# Patient Record
Sex: Female | Born: 1955 | Hispanic: Yes | Marital: Married | Smoking: Never smoker
Health system: Southern US, Community
[De-identification: ages and names within clinical notes are randomized; demographics above are authoritative.]

## PROBLEM LIST (undated history)

## (undated) DIAGNOSIS — I428 Other cardiomyopathies: Secondary | ICD-10-CM

## (undated) DIAGNOSIS — I5020 Unspecified systolic (congestive) heart failure: Secondary | ICD-10-CM

## (undated) DIAGNOSIS — E785 Hyperlipidemia, unspecified: Secondary | ICD-10-CM

## (undated) HISTORY — DX: Other cardiomyopathies: I42.8

## (undated) HISTORY — PX: TONSILLECTOMY: SUR1361

## (undated) HISTORY — DX: Hyperlipidemia, unspecified: E78.5

## (undated) HISTORY — PX: APPENDECTOMY: SHX54

## (undated) HISTORY — DX: Unspecified systolic (congestive) heart failure: I50.20

---

## 2007-10-25 ENCOUNTER — Emergency Department: Payer: Self-pay | Admitting: Emergency Medicine

## 2007-10-25 ENCOUNTER — Ambulatory Visit: Payer: Self-pay | Admitting: Internal Medicine

## 2007-10-25 ENCOUNTER — Other Ambulatory Visit: Payer: Self-pay

## 2007-10-26 ENCOUNTER — Ambulatory Visit: Payer: Self-pay | Admitting: Emergency Medicine

## 2008-09-03 ENCOUNTER — Ambulatory Visit: Payer: Self-pay | Admitting: Family Medicine

## 2008-09-06 ENCOUNTER — Ambulatory Visit: Payer: Self-pay | Admitting: Family Medicine

## 2010-12-11 IMAGING — CR DG HIP COMPLETE 2+V*L*
1 series · 2 of 2 positions shown · non-contrast
Comparison: none

REASON FOR EXAM: pain left hip
COMMENTS:

PROCEDURE:     MDR - MDR HIP LEFT COMPLETE  - September 03, 2008  [DATE]
RESULT:     AP and lateral views of the left hip reveal no evidence of
fracture nor dislocation nor significant degenerative change. The observed
portions of the left hemipelvis appear normal.

[Series 1: view not recorded · 0.17mm/px · 2 of 2 slices shown]
[im 1/2]
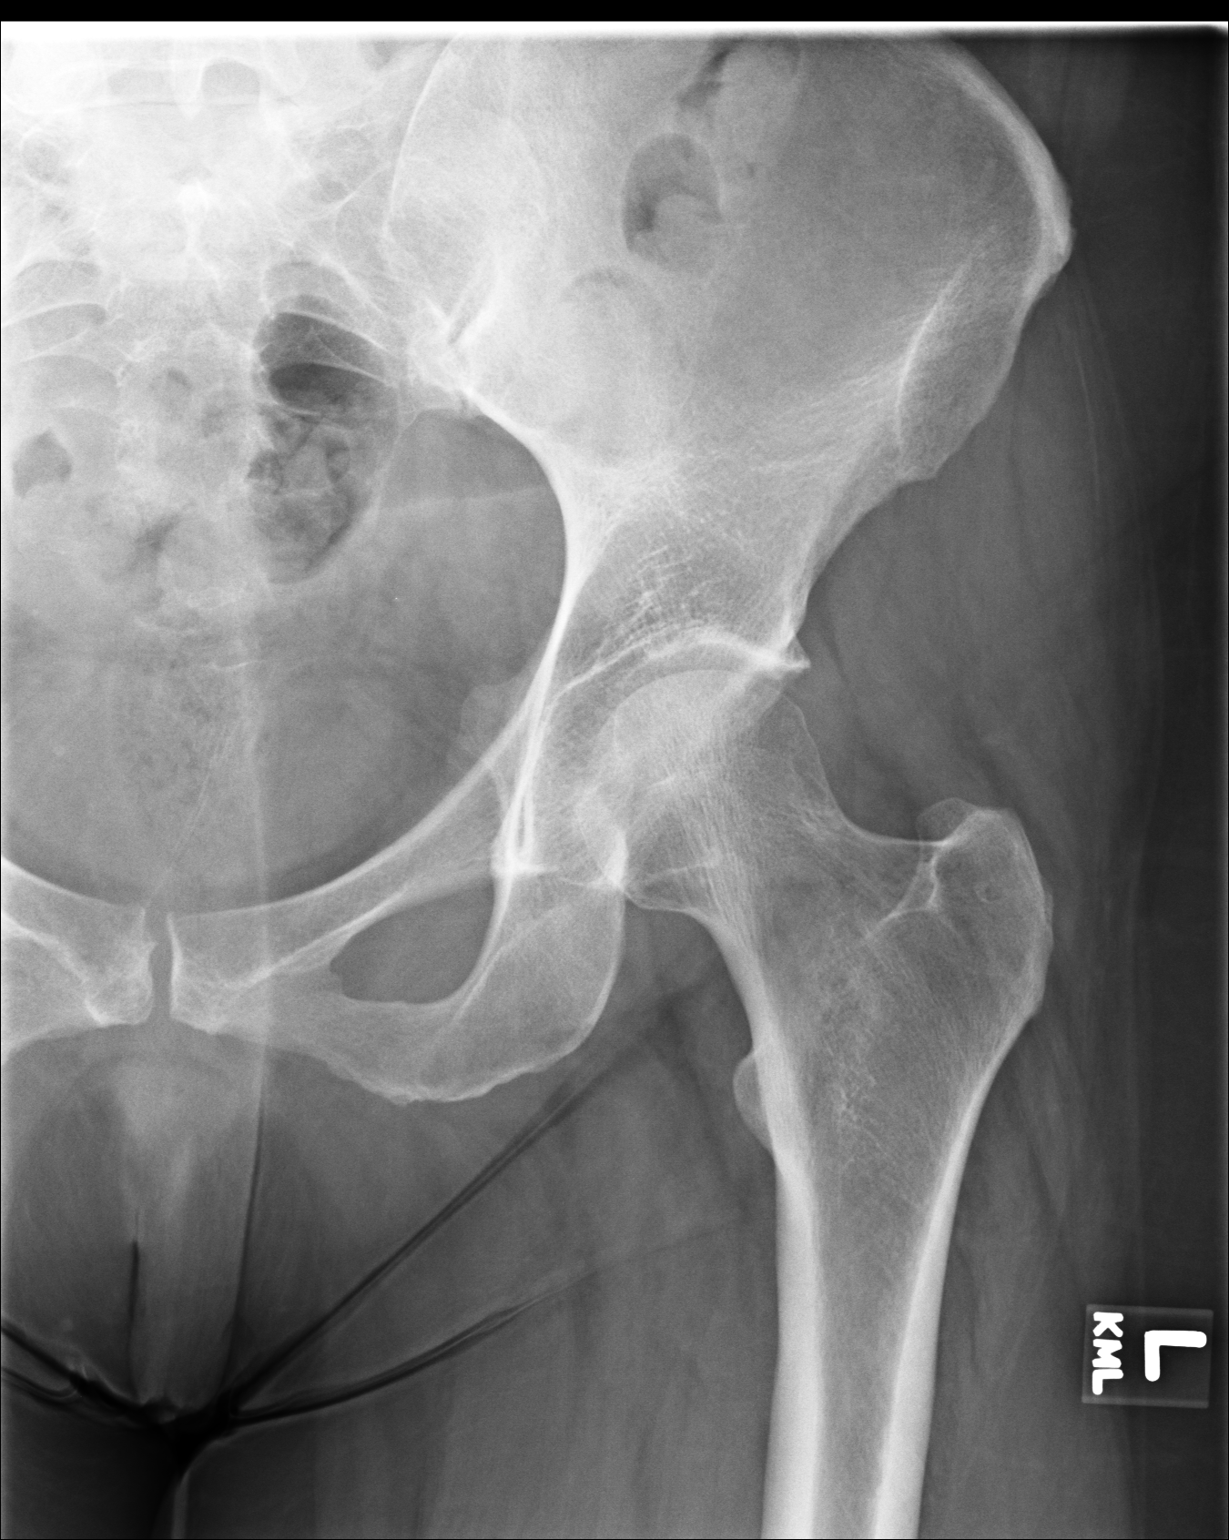
[im 2/2]
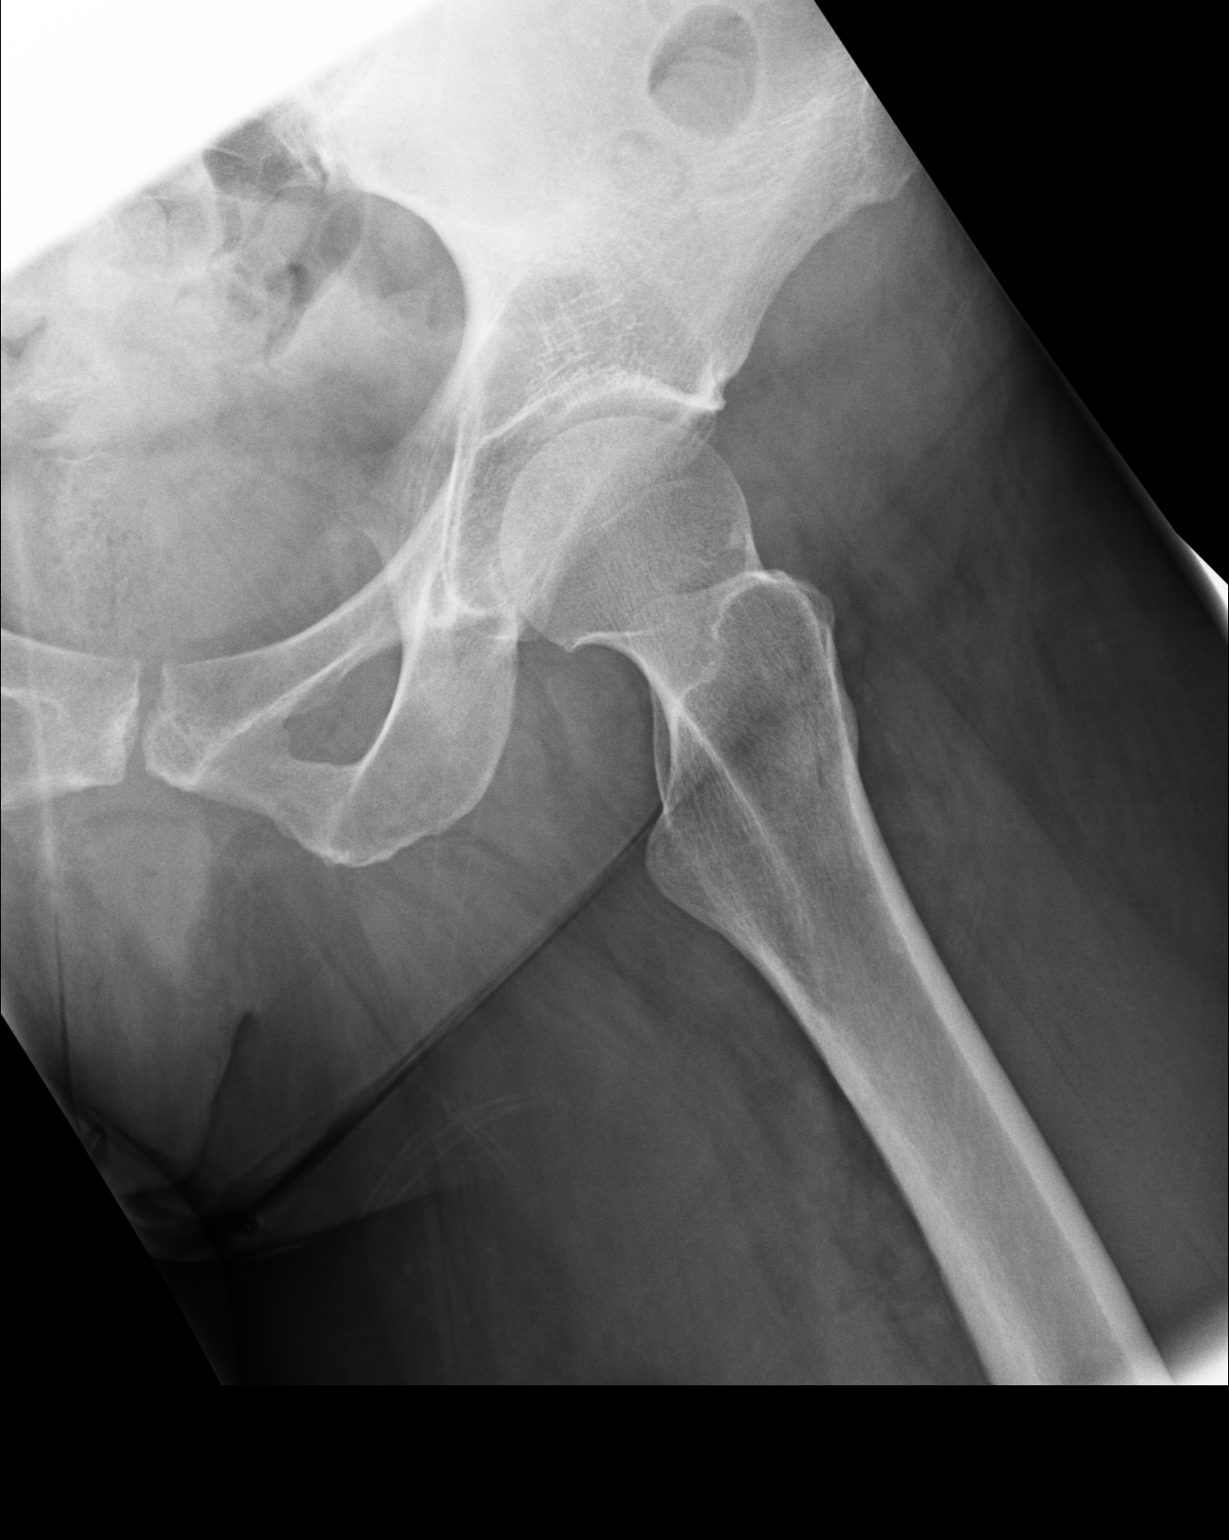

[2 of 2 positions shown; findings below may reference images not displayed]

IMPRESSION: I see no acute abnormality of the left hip. Followup
imaging is available if the patient's symptoms persist and remain
unexplained.

## 2011-04-27 ENCOUNTER — Ambulatory Visit: Payer: Self-pay | Admitting: Pediatrics

## 2018-04-12 ENCOUNTER — Ambulatory Visit
Admission: EM | Admit: 2018-04-12 | Discharge: 2018-04-12 | Disposition: A | Discharge status: Home / Self Care | Payer: BC Managed Care – PPO | Attending: Family Medicine | Admitting: Family Medicine

## 2018-04-12 ENCOUNTER — Other Ambulatory Visit: Payer: Self-pay

## 2018-04-12 ENCOUNTER — Encounter: Payer: Self-pay | Admitting: Emergency Medicine

## 2018-04-12 DIAGNOSIS — J111 Influenza due to unidentified influenza virus with other respiratory manifestations: Secondary | ICD-10-CM | POA: Diagnosis not present

## 2018-04-12 LAB — RAPID INFLUENZA A&B ANTIGENS: Influenza A (ARMC): POSITIVE — AB

## 2018-04-12 LAB — RAPID INFLUENZA A&B ANTIGENS (ARMC ONLY): INFLUENZA B (ARMC): NEGATIVE

## 2018-04-12 MED ORDER — OSELTAMIVIR PHOSPHATE 75 MG PO CAPS: 75.0000 mg | ORAL_CAPSULE | Freq: Two times a day (BID) | ORAL | 0 refills | Status: DC

## 2018-04-12 NOTE — ED Triage Notes (Signed)
Patient c/o cough, fever, and bodyaches that started 2 days ago.

## 2018-04-12 NOTE — ED Provider Notes (Signed)
[facility]    CSN: [id number] Arrival date & time: 04/12/18  1642  History   Chief Complaint Chief Complaint  Patient presents with  . Cough  . Fever  . Generalized Body Aches   HPI  63 year old female presents with concerns for influenza.  Symptoms started on Sunday.  Patient reports fever, chills, body aches, cough.  Associated weakness.  Symptoms are severe.  Patient is concerned that she has the flu.  No known exacerbating relieving factors.  No other associated symptoms.  No other complaints.  History reviewed and updated as below.  PMH:  Cervical radiculopathy History of abnormal mammogram  Past Surgical History:  Procedure Laterality Date  . APPENDECTOMY    . CESAREAN SECTION    . TONSILLECTOMY     OB History   No obstetric history on file.    Home Medications    Prior to Admission medications   Medication Sig Start Date End Date Taking? Authorizing Provider  oseltamivir (TAMIFLU) 75 MG capsule Take 1 capsule (75 mg total) by mouth every 12 (twelve) hours. 04/12/18   Tommie Sams, DO   Social History Social History   Tobacco Use  . Smoking status: Never Smoker  . Smokeless tobacco: Never Used  Substance Use Topics  . Alcohol use: Yes  . Drug use: Never   Allergies   Patient has no known allergies.   Review of Systems Review of Systems  Constitutional: Positive for chills and fever.  Respiratory: Positive for cough.   Musculoskeletal:       Body aches.   Neurological: Positive for weakness.   Physical Exam Triage Vital Signs ED Triage Vitals  Enc Vitals Group     BP 04/12/18 1659 102/81     Pulse Rate 04/12/18 1659 84     Resp 04/12/18 1659 14     Temp 04/12/18 1659 98.6 F (37 C)     Temp Source 04/12/18 1659 Oral     SpO2 04/12/18 1659 96 %     Weight 04/12/18 1657 150 lb (68 kg)     Height 04/12/18 1657 5\' 2"  (1.575 m)     Head Circumference --      Peak Flow --      Pain Score 04/12/18 1656 4     Pain Loc --    Pain Edu? --      Excl. in GC? --    Updated Vital Signs BP 102/81 (BP Location: Left Arm)   Pulse 84   Temp 98.6 F (37 C) (Oral)   Resp 14   Ht 5\' 2"  (1.575 m)   Wt 68 kg   SpO2 96%   BMI 27.44 kg/m   Visual Acuity Right Eye Distance:   Left Eye Distance:   Bilateral Distance:    Right Eye Near:   Left Eye Near:    Bilateral Near:     Physical Exam Vitals signs and nursing note reviewed.  Constitutional:      General: She is not in acute distress.    Appearance: Normal appearance.  HENT:     Head: Normocephalic and atraumatic.     Right Ear: Tympanic membrane normal.     Left Ear: Tympanic membrane normal.     Mouth/Throat:     Pharynx: Oropharynx is clear. No posterior oropharyngeal erythema.  Eyes:     General:        Right eye: No discharge.        Left eye: No discharge.  Conjunctiva/sclera: Conjunctivae normal.  Cardiovascular:     Rate and Rhythm: Normal rate and regular rhythm.  Pulmonary:     Effort: Pulmonary effort is normal.     Breath sounds: Normal breath sounds.  Neurological:     Mental Status: She is alert.  Psychiatric:        Mood and Affect: Mood normal.        Behavior: Behavior normal.    UC Treatments / Results  Labs (all labs ordered are listed, but only abnormal results are displayed) Labs Reviewed  RAPID INFLUENZA A&B ANTIGENS ([facility] ONLY) - Abnormal; Notable for the following components:      Result Value   Influenza A ([facility]) POSITIVE (*)    All other components within normal limits    EKG None  Radiology No results found.  Procedures Procedures (including critical care time)  Medications Ordered in UC Medications - No data to display  Initial Impression / Assessment and Plan / UC Course  I have reviewed the triage vital signs and the nursing notes.  Pertinent labs & imaging results that were available during my care of the patient were reviewed by me and considered in my medical decision making (see chart for  details).    63 year old female presents with influenza.  Treating with Tamiflu.  Work note given.  Final Clinical Impressions(s) / UC Diagnoses   Final diagnoses:  Influenza   Discharge Instructions   None    ED Prescriptions    Medication Sig Dispense Auth. Provider   oseltamivir (TAMIFLU) 75 MG capsule Take 1 capsule (75 mg total) by mouth every 12 (twelve) hours. 10 capsule Tommie Sams, DO     Controlled Substance Prescriptions [place] Controlled Substance Registry consulted? Not Applicable   Tommie Sams, DO 04/12/18 1815

## 2019-03-20 ENCOUNTER — Other Ambulatory Visit: Payer: Self-pay

## 2019-03-20 DIAGNOSIS — Z20822 Contact with and (suspected) exposure to covid-19: Secondary | ICD-10-CM

## 2019-03-21 LAB — NOVEL CORONAVIRUS, NAA: SARS-CoV-2, NAA: NOT DETECTED

## 2019-05-15 ENCOUNTER — Other Ambulatory Visit: Payer: Self-pay

## 2019-05-15 DIAGNOSIS — Z20822 Contact with and (suspected) exposure to covid-19: Secondary | ICD-10-CM

## 2019-05-16 LAB — SARS-COV-2, NAA 2 DAY TAT

## 2019-05-16 LAB — NOVEL CORONAVIRUS, NAA: SARS-CoV-2, NAA: NOT DETECTED

## 2019-08-14 ENCOUNTER — Other Ambulatory Visit: Payer: Self-pay

## 2019-08-14 DIAGNOSIS — Z20822 Contact with and (suspected) exposure to covid-19: Secondary | ICD-10-CM

## 2019-08-15 LAB — SARS-COV-2, NAA 2 DAY TAT

## 2019-08-15 LAB — NOVEL CORONAVIRUS, NAA: SARS-CoV-2, NAA: NOT DETECTED

## 2019-10-18 ENCOUNTER — Other Ambulatory Visit: Payer: Self-pay | Admitting: Radiology

## 2019-10-18 ENCOUNTER — Other Ambulatory Visit: Payer: BC Managed Care – PPO

## 2019-10-18 DIAGNOSIS — Z20822 Contact with and (suspected) exposure to covid-19: Secondary | ICD-10-CM

## 2019-10-20 LAB — NOVEL CORONAVIRUS, NAA: SARS-CoV-2, NAA: NOT DETECTED

## 2019-10-20 LAB — SARS-COV-2, NAA 2 DAY TAT

## 2020-06-20 ENCOUNTER — Ambulatory Visit
Admission: EM | Admit: 2020-06-20 | Discharge: 2020-06-20 | Disposition: A | Discharge status: Home / Self Care | Payer: BC Managed Care – PPO | Attending: Family Medicine | Admitting: Family Medicine

## 2020-06-20 ENCOUNTER — Encounter: Payer: Self-pay | Admitting: Emergency Medicine

## 2020-06-20 ENCOUNTER — Other Ambulatory Visit: Payer: Self-pay

## 2020-06-20 DIAGNOSIS — N390 Urinary tract infection, site not specified: Secondary | ICD-10-CM | POA: Diagnosis present

## 2020-06-20 LAB — URINALYSIS, COMPLETE (UACMP) WITH MICROSCOPIC
Bilirubin Urine: NEGATIVE
Glucose, UA: NEGATIVE mg/dL
Ketones, ur: NEGATIVE mg/dL
Nitrite: NEGATIVE
Protein, ur: NEGATIVE mg/dL
Specific Gravity, Urine: 1.02 (ref 1.005–1.030)
WBC, UA: >50 WBC/hpf (ref 0–5)
pH: 7 (ref 5.0–8.0)

## 2020-06-20 MED ORDER — PHENAZOPYRIDINE HCL 200 MG PO TABS: 200.0000 mg | ORAL_TABLET | Freq: Three times a day (TID) | ORAL | 0 refills | Status: DC

## 2020-06-20 MED ORDER — NITROFURANTOIN MONOHYD MACRO 100 MG PO CAPS: 100.0000 mg | ORAL_CAPSULE | Freq: Two times a day (BID) | ORAL | 0 refills | Status: DC

## 2020-06-20 NOTE — ED Provider Notes (Signed)
[facility]    CSN: [id number] Arrival date & time: 06/20/20  1223      History   Chief Complaint Chief Complaint  Patient presents with  . Dysuria  . Urinary Frequency    HPI KEYIA MORETTO is a 65 y.o. female.   HPI   65 year old female here for evaluation of urinary complaints.  Patient reports that 4 days ago she developed some painful urination with urinary urgency, frequency, and hematuria.  She reports that the symptoms resolved the 2 days following and then returned yesterday.  She states that she is still having painful urination and urinary frequency and she has seen some blood in her urine.  She denies fever, abdominal pain, back pain, or cloudiness to her urine.  History reviewed. No pertinent past medical history.  There are no problems to display for this patient.   Past Surgical History:  Procedure Laterality Date  . APPENDECTOMY    . CESAREAN SECTION    . TONSILLECTOMY      OB History   No obstetric history on file.      Home Medications    Prior to Admission medications   Medication Sig Start Date End Date Taking? Authorizing Provider  nitrofurantoin, macrocrystal-monohydrate, (MACROBID) 100 MG capsule Take 1 capsule (100 mg total) by mouth 2 (two) times daily. 06/20/20  Yes Becky Augusta, NP  phenazopyridine (PYRIDIUM) 200 MG tablet Take 1 tablet (200 mg total) by mouth 3 (three) times daily. 06/20/20  Yes Becky Augusta, NP    Family History History reviewed. No pertinent family history.  Social History Social History   Tobacco Use  . Smoking status: Never Smoker  . Smokeless tobacco: Never Used  Vaping Use  . Vaping Use: Never used  Substance Use Topics  . Alcohol use: Yes  . Drug use: Never     Allergies   Patient has no known allergies.   Review of Systems Review of Systems  Constitutional: Negative for activity change, appetite change and fever.  Gastrointestinal: Negative for abdominal pain, diarrhea, nausea and  vomiting.  Genitourinary: Positive for dysuria, frequency, hematuria and urgency.  Musculoskeletal: Negative for back pain.  Skin: Negative for rash.  Hematological: Negative.   Psychiatric/Behavioral: Negative.      Physical Exam Triage Vital Signs ED Triage Vitals  Enc Vitals Group     BP 06/20/20 1254 124/82     Pulse Rate 06/20/20 1254 64     Resp 06/20/20 1254 18     Temp 06/20/20 1254 98.1 F (36.7 C)     Temp Source 06/20/20 1254 Oral     SpO2 06/20/20 1254 100 %     Weight 06/20/20 1253 149 lb 14.6 oz (68 kg)     Height 06/20/20 1253 5\' 2"  (1.575 m)     Head Circumference --      Peak Flow --      Pain Score 06/20/20 1253 0     Pain Loc --      Pain Edu? --      Excl. in GC? --    No data found.  Updated Vital Signs BP 124/82 (BP Location: Right Arm)   Pulse 64   Temp 98.1 F (36.7 C) (Oral)   Resp 18   Ht 5\' 2"  (1.575 m)   Wt 149 lb 14.6 oz (68 kg)   SpO2 100%   BMI 27.42 kg/m   Visual Acuity Right Eye Distance:   Left Eye Distance:   Bilateral Distance:  Right Eye Near:   Left Eye Near:    Bilateral Near:     Physical Exam Vitals and nursing note reviewed.  Constitutional:      General: She is not in acute distress.    Appearance: Normal appearance. She is normal weight. She is not ill-appearing.  HENT:     Head: Normocephalic and atraumatic.  Cardiovascular:     Rate and Rhythm: Normal rate and regular rhythm.     Pulses: Normal pulses.     Heart sounds: Normal heart sounds. No murmur heard. No gallop.   Pulmonary:     Effort: Pulmonary effort is normal.     Breath sounds: Normal breath sounds. No wheezing, rhonchi or rales.  Abdominal:     General: Abdomen is flat.     Palpations: Abdomen is soft.     Tenderness: There is no abdominal tenderness. There is no right CVA tenderness, left CVA tenderness, guarding or rebound.  Skin:    General: Skin is warm and dry.     Capillary Refill: Capillary refill takes less than 2 seconds.      Findings: No erythema or rash.  Neurological:     General: No focal deficit present.     Mental Status: She is alert and oriented to person, place, and time.  Psychiatric:        Mood and Affect: Mood normal.        Behavior: Behavior normal.        Thought Content: Thought content normal.        Judgment: Judgment normal.      UC Treatments / Results  Labs (all labs ordered are listed, but only abnormal results are displayed) Labs Reviewed  URINALYSIS, COMPLETE (UACMP) WITH MICROSCOPIC - Abnormal; Notable for the following components:      Result Value   APPearance HAZY (*)    Hgb urine dipstick MODERATE (*)    Leukocytes,Ua SMALL (*)    Bacteria, UA MANY (*)    All other components within normal limits  URINE CULTURE    EKG   Radiology No results found.  Procedures Procedures (including critical care time)  Medications Ordered in UC Medications - No data to display  Initial Impression / Assessment and Plan / UC Course  I have reviewed the triage vital signs and the nursing notes.  Pertinent labs & imaging results that were available during my care of the patient were reviewed by me and considered in my medical decision making (see chart for details).   Patient is a very pleasant 65 year old female who is here for evaluation of urinary complaints of been going on for the past 4 days off and on.  She is complaining of pain with urination, frequency, blood in her urine, and states she had urgency on the first day but has not since then.  She denies fever, abdominal pain, back pain, or cloudiness to her urine.  Patient's physical exam reveals a benign cardiopulmonary exam.  Abdomen is soft and nontender to palpation.  No CVA tenderness on exam.  Will send urine for analysis.  Urinalysis shows moderate hemoglobin, small leukocytes, greater than 50 WBCs, many bacteria, and WBCs in clumps.  We will send urine for culture.  We will treat patient for UTI with Macrobid twice  daily for 5 days and Pyridium for urinary discomfort.   Final Clinical Impressions(s) / UC Diagnoses   Final diagnoses:  Lower urinary tract infectious disease     Discharge Instructions  Take the Macrobid twice daily for 5 days with food for treatment of urinary tract infection.  Use the Pyridium every 8 hours as needed for urinary discomfort.  This will turn your urine a bright red-orange.  Increase your oral fluid intake so that you increase your urine production and or flushing your urinary system.  Take an over-the-counter probiotic, such as Culturelle-Align-Activia, 1 hour after each dose of antibiotic to prevent diarrhea or yeast infections from forming.  We will culture urine and change the antibiotics if necessary.  Return for reevaluation, or see your primary care provider, for any new or worsening symptoms.     ED Prescriptions    Medication Sig Dispense Auth. Provider   nitrofurantoin, macrocrystal-monohydrate, (MACROBID) 100 MG capsule Take 1 capsule (100 mg total) by mouth 2 (two) times daily. 10 capsule Becky Augusta, NP   phenazopyridine (PYRIDIUM) 200 MG tablet Take 1 tablet (200 mg total) by mouth 3 (three) times daily. 6 tablet Becky Augusta, NP     PDMP not reviewed this encounter.   Becky Augusta, NP 06/20/20 1355

## 2020-06-20 NOTE — ED Triage Notes (Signed)
Patient c/o dysuria and urinary frequency that started on Sunday.

## 2020-06-20 NOTE — Discharge Instructions (Addendum)

## 2020-06-22 LAB — URINE CULTURE
Culture: NO GROWTH
Special Requests: NORMAL

## 2020-12-22 LAB — COLOGUARD: COLOGUARD: NEGATIVE

## 2020-12-22 LAB — EXTERNAL GENERIC LAB PROCEDURE: COLOGUARD: NEGATIVE

## 2021-03-23 ENCOUNTER — Encounter: Payer: Self-pay | Admitting: Emergency Medicine

## 2021-03-23 ENCOUNTER — Inpatient Hospital Stay
Admission: EM | Admit: 2021-03-23 | Discharge: 2021-03-25 | DRG: 280 | Disposition: A | Discharge status: Home / Self Care | Payer: BC Managed Care – PPO | Attending: Internal Medicine | Admitting: Internal Medicine

## 2021-03-23 ENCOUNTER — Other Ambulatory Visit: Payer: Self-pay

## 2021-03-23 ENCOUNTER — Emergency Department: Payer: BC Managed Care – PPO

## 2021-03-23 DIAGNOSIS — R7989 Other specified abnormal findings of blood chemistry: Secondary | ICD-10-CM | POA: Diagnosis present

## 2021-03-23 DIAGNOSIS — I5021 Acute systolic (congestive) heart failure: Secondary | ICD-10-CM | POA: Diagnosis present

## 2021-03-23 DIAGNOSIS — R778 Other specified abnormalities of plasma proteins: Secondary | ICD-10-CM

## 2021-03-23 DIAGNOSIS — Z8249 Family history of ischemic heart disease and other diseases of the circulatory system: Secondary | ICD-10-CM

## 2021-03-23 DIAGNOSIS — R0789 Other chest pain: Secondary | ICD-10-CM

## 2021-03-23 DIAGNOSIS — Z7983 Long term (current) use of bisphosphonates: Secondary | ICD-10-CM

## 2021-03-23 DIAGNOSIS — I214 Non-ST elevation (NSTEMI) myocardial infarction: Secondary | ICD-10-CM | POA: Diagnosis not present

## 2021-03-23 DIAGNOSIS — E785 Hyperlipidemia, unspecified: Secondary | ICD-10-CM | POA: Diagnosis present

## 2021-03-23 DIAGNOSIS — R079 Chest pain, unspecified: Secondary | ICD-10-CM | POA: Diagnosis present

## 2021-03-23 DIAGNOSIS — R001 Bradycardia, unspecified: Secondary | ICD-10-CM | POA: Diagnosis present

## 2021-03-23 DIAGNOSIS — Z20822 Contact with and (suspected) exposure to covid-19: Secondary | ICD-10-CM | POA: Diagnosis present

## 2021-03-23 LAB — BASIC METABOLIC PANEL
Anion gap: 11 (ref 5–15)
BUN: 20 mg/dL (ref 8–23)
CO2: 21 mmol/L — ABNORMAL LOW (ref 22–32)
Calcium: 9.5 mg/dL (ref 8.9–10.3)
Chloride: 107 mmol/L (ref 98–111)
Creatinine, Ser: 0.8 mg/dL (ref 0.44–1.00)
GFR, Estimated: >60 mL/min (ref >60)
Glucose, Bld: 111 mg/dL — ABNORMAL HIGH (ref 70–99)
Potassium: 3.5 mmol/L (ref 3.5–5.1)
Sodium: 139 mmol/L (ref 135–145)

## 2021-03-23 LAB — CBC
HCT: 42.8 % (ref 36.0–46.0)
Hemoglobin: 13.9 g/dL (ref 12.0–15.0)
MCH: 30.6 pg (ref 26.0–34.0)
MCHC: 32.5 g/dL (ref 30.0–36.0)
MCV: 94.3 fL (ref 80.0–100.0)
Platelets: 387 10*3/uL (ref 150–400)
RBC: 4.54 MIL/uL (ref 3.87–5.11)
RDW: 12.3 % (ref 11.5–15.5)
WBC: 9.7 10*3/uL (ref 4.0–10.5)
nRBC: 0 % (ref 0.0–0.2)

## 2021-03-23 LAB — RESP PANEL BY RT-PCR (FLU A&B, COVID) ARPGX2
Influenza A by PCR: NEGATIVE
Influenza B by PCR: NEGATIVE
SARS Coronavirus 2 by RT PCR: NEGATIVE

## 2021-03-23 LAB — PROTIME-INR
INR: 1 (ref 0.8–1.2)
Prothrombin Time: 13.4 seconds (ref 11.4–15.2)

## 2021-03-23 LAB — TROPONIN I (HIGH SENSITIVITY)
Troponin I (High Sensitivity): 7 ng/L (ref <18)
Troponin I (High Sensitivity): 554 ng/L (ref <18)

## 2021-03-23 LAB — APTT: aPTT: >200 seconds (ref 24–36)

## 2021-03-23 LAB — MAGNESIUM: Magnesium: 2.3 mg/dL (ref 1.7–2.4)

## 2021-03-23 LAB — TSH: TSH: 1.126 u[IU]/mL (ref 0.350–4.500)

## 2021-03-23 LAB — BRAIN NATRIURETIC PEPTIDE: B Natriuretic Peptide: 19.3 pg/mL (ref 0.0–100.0)

## 2021-03-23 LAB — PHOSPHORUS: Phosphorus: 2.7 mg/dL (ref 2.5–4.6)

## 2021-03-23 MED ORDER — ASPIRIN 81 MG PO CHEW
324.0000 mg | CHEWABLE_TABLET | Freq: Once | ORAL | Status: AC
  Administered 2021-03-23: 324 mg via ORAL
  Filled 2021-03-23: qty 4

## 2021-03-23 MED ORDER — NITROGLYCERIN 0.4 MG SL SUBL
0.4000 mg | SUBLINGUAL_TABLET | SUBLINGUAL | Status: DC | PRN
  Administered 2021-03-23 – 2021-03-24 (×6): 0.4 mg via SUBLINGUAL
  Filled 2021-03-23 (×5): qty 1

## 2021-03-23 MED ORDER — MELATONIN 5 MG PO TABS: 5.0000 mg | ORAL_TABLET | Freq: Every evening | ORAL | Status: DC | PRN

## 2021-03-23 MED ORDER — HEPARIN BOLUS VIA INFUSION
3300.0000 [IU] | Freq: Once | INTRAVENOUS | Status: AC
  Administered 2021-03-23: 3300 [IU] via INTRAVENOUS
  Filled 2021-03-23: qty 3300

## 2021-03-23 MED ORDER — ACETAMINOPHEN 650 MG RE SUPP: 650.0000 mg | Freq: Four times a day (QID) | RECTAL | Status: DC | PRN

## 2021-03-23 MED ORDER — ACETAMINOPHEN 500 MG PO TABS: 1000.0000 mg | ORAL_TABLET | Freq: Four times a day (QID) | ORAL | Status: DC | PRN

## 2021-03-23 MED ORDER — ACETAMINOPHEN 325 MG PO TABS: 650.0000 mg | ORAL_TABLET | Freq: Four times a day (QID) | ORAL | Status: DC | PRN

## 2021-03-23 MED ORDER — ONDANSETRON HCL 4 MG/2ML IJ SOLN: 4.0000 mg | Freq: Four times a day (QID) | INTRAMUSCULAR | Status: DC | PRN

## 2021-03-23 MED ORDER — ONDANSETRON HCL 4 MG PO TABS: 4.0000 mg | ORAL_TABLET | Freq: Four times a day (QID) | ORAL | Status: DC | PRN

## 2021-03-23 MED ORDER — HEPARIN (PORCINE) 25000 UT/250ML-% IV SOLN
750.0000 [IU]/h | INTRAVENOUS | Status: DC
  Administered 2021-03-23: 22:00:00 650 [IU]/h via INTRAVENOUS
  Filled 2021-03-23: qty 250

## 2021-03-23 NOTE — Assessment & Plan Note (Addendum)
-   Etiology work-up in progress - Differentials include atypical versus angina versus ACS - Continue heparin GTT - Continue nitroglycerin sublingual as needed for chest pain - Status post aspirin 324 mg p.o. on day of admission - Check BNP - Heart score is 5, with history (1), age (1), family history (1), and elevated trop (2) - PRN EKG order for recurrent chest pain/chest pressure - A.m. team to consult cardiology

## 2021-03-23 NOTE — Assessment & Plan Note (Signed)
-   Asymptomatic - PCP regular follow-up

## 2021-03-23 NOTE — Assessment & Plan Note (Addendum)
-   High-sensitivity troponin was initially 2 and increased to 554 - Continue heparin GTT - Complete echo ordered - Admit to telemetry cardiac, observation

## 2021-03-23 NOTE — H&P (Addendum)
History and Physical   Cindy [name] [id number] DOB: 1955/07/26 DOA: 03/23/2021  PCP: Danae Orleans, MD  Patient coming from: home via [place]  I have personally briefly reviewed patient's old medical records in [place].  Chief Concern: Chest pain  HPI: 66 year old female with no prior medical diagnosis who presents emergency department for chief concerns of left-sided chest pain.  Vitals in the emergency department showed temperature of 98.5, respiration rate of 16, heart rate of 54, initial blood pressure 136/86, SPO2 of 99% on room air.  Labs in the emergency department showed serum sodium 139, potassium 3.5, chloride of 107, bicarb 21, BUN of 20, serum creatinine of 0.80, nonfasting blood glucose 111, WBC 9.7, hemoglobin 13.9, platelets of 387.  High sensitive troponin was 2 and increased to 554.  At bedside, she was able to tell me her name, age, and current calendar year.   She states she presented to ED for chief concern of chest pain, that started while she was sitting and playing with her phone. She states that at the onset at it's peak, she felt like an elephant was sitting on her chest. She states hte initial pain was a 7-8/10. This started around 4 pm. And it lasted hours until she received the nitroglycerin in the ED.   She endorsed nausea, pain in her left arm (sharp). The left arm pain resolved first after she received NG SL. She denies headache. She endorsed breathing deeply due to the pain. She denies shortness of breath. She denies abdominal pain, diarrhea, dysuria, hematuria, vomiting.   She denies ever feeling this way before.   She denies feeling stressed. She reports that as of December 2022, her husband accepted a new job in [place] and they have been planning the moving and buying a new home. She states today, she was on the phone with him and told him that she felt so excited and she felt like she was planning her wedding all over again. She did not feel  particularly stress.   She denies trauma to her person. She reports the pain level is a 2/10 currently.   She reports 20 pound intentional weight loss with portion control after her PCP advised her to loose weight about mid year in 2022. She reports that she walks daily and her goal daily steps is 6000 per day.   Family history: Father had hypertension and heart stents (approximately 64 yo)  Social history: She lives with her husband and two kids. She denies tobacco and recreational drug use. She endorses infrequent etoh, last drink was 1 month ago. 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   ROS: Constitutional: no weight change, no fever ENT/Mouth: no sore throat, no rhinorrhea Eyes: no eye pain, no vision changes Cardiovascular: no chest pain, no dyspnea,  no edema, no palpitations Respiratory: no cough, no sputum, no wheezing Gastrointestinal: no nausea, no vomiting, no diarrhea, no constipation Genitourinary: no urinary incontinence, no dysuria, no hematuria Musculoskeletal: no arthralgias, no myalgias Skin: no skin lesions, no pruritus, Neuro: + weakness, no loss of consciousness, no syncope Psych: no anxiety, no depression, no decrease appetite Heme/Lymph: no bruising, no bleeding  ED Course: Discussed with emergency medicine provider, patient require hospitalization for chief concerns of NSTEMI.  Assessment/Plan  Principal Problem:   Chest pain Active Problems:   Elevated  troponin   Sinus bradycardia by electrocardiogram   NSTEMI (non-ST elevated myocardial infarction) (HCC)  * Chest pain Assessment & Plan - Etiology work-up in progress - Differentials include atypical versus angina versus ACS - Continue heparin GTT - Continue nitroglycerin sublingual as needed for chest pain - Status post aspirin  324 mg p.o. on day of admission - Check BNP, lipid panel in the AM - A.m. team to consider consultation to cardiology  NSTEMI (non-ST elevated myocardial infarction) ([place]) Elevated troponin Assessment & Plan - High-sensitivity troponin was initially 2 and increased to 554 - Continue heparin GTT - Repeat troponin - Admit to telemetry cardiac, observation  Sinus bradycardia by electrocardiogram Assessment & Plan - Asymptomatic - PCP regular follow-up  Chart reviewed.   DVT prophylaxis: Heparin GGT Code Status: full code  Diet: Heart healthy Family Communication: daughter at bedside with patient's permission Disposition Plan: Pending clinical course Consults called: Patient will need cardiology evaluation Admission status: Observation, telemetry cardiac  History reviewed. No pertinent past medical history.  Past Surgical History:  Procedure Laterality Date   APPENDECTOMY     CESAREAN SECTION     TONSILLECTOMY     Social History:  reports that she has never smoked. She has never used smokeless tobacco. She reports current alcohol use. She reports that she does not use drugs.  No Known Allergies Family History  Problem Relation Age of Onset   Hypertension Father    Heart disease Father    Family history: Family history reviewed and not pertinent.  Prior to Admission medications   Medication Sig Start Date End Date Taking? Authorizing Provider  alendronate (FOSAMAX) 70 MG tablet Take 70 mg by mouth once a week. 03/13/21  Yes [provider]   Physical Exam: Vitals:   03/23/21 1930 03/23/21 2000 03/23/21 2030 03/23/21 2130  BP: 131/69 117/70 (!) 116/101 128/63  Pulse: (!) 43 (!) 47 (!) 47 (!) 35  Resp: 11 17 13 20   Temp:      TempSrc:      SpO2: 100% 100% 100% (!) 81%  Weight:      Height:       Constitutional: appears younger than chronological age, NAD, calm, comfortable Eyes: PERRL, lids and conjunctivae normal ENMT: Mucous membranes are moist.  Posterior pharynx clear of any exudate or lesions. Age-appropriate dentition. Hearing appropriate Neck: normal, supple, no masses, no thyromegaly Respiratory: clear to auscultation bilaterally, no wheezing, no crackles. Normal respiratory effort. No accessory muscle use.  Cardiovascular: Sinus bradycardia, and regular rhythm, no murmurs / rubs / gallops. No extremity edema. 2+ pedal pulses. No carotid bruits.  Abdomen: no tenderness, no masses palpated, no hepatosplenomegaly. Bowel sounds positive.  Musculoskeletal: no clubbing / cyanosis. No joint deformity upper and lower extremities. Good ROM, no contractures, no atrophy. Normal muscle tone.  Skin: no rashes, lesions, ulcers. No induration Neurologic: Sensation intact. Strength 5/5 in all 4.  Psychiatric: Normal judgment and insight. Alert and oriented x 3. Normal mood.   EKG: independently reviewed, showing sinus bradycardia with rate of 51, QTc 409  Chest x-ray on Admission: I personally reviewed and I agree with radiologist reading as below.  DG Chest 2 View  Result Date: 03/23/2021 CLINICAL DATA:  Acute onset left-sided chest pain 20 minutes ago. Shortness of breath. EXAM: CHEST - 2 VIEW COMPARISON:  None. FINDINGS: The heart size and mediastinal contours are within normal limits. Both lungs are clear. The visualized skeletal structures are unremarkable. IMPRESSION: No active cardiopulmonary disease. Electronically Signed  By: Marlaine Hind M.D.   On: 03/23/2021 18:16    Labs on Admission: I have personally reviewed following labs  CBC: Recent Labs  Lab 03/23/21 1752  WBC 9.7  HGB 13.9  HCT 42.8  MCV 94.3  PLT [id number]   Basic Metabolic Panel: Recent Labs  Lab 03/23/21 1752  NA 139  K 3.5  CL 107  CO2 21*  GLUCOSE 111*  BUN 20  CREATININE 0.80  CALCIUM 9.5   GFR: Estimated Creatinine Clearance: 56.9 mL/min (by C-G formula based on SCr of 0.8 mg/dL).  Urine analysis:    Component Value Date/Time   COLORURINE YELLOW  06/20/2020 1256   APPEARANCEUR HAZY (A) 06/20/2020 1256   LABSPEC 1.020 06/20/2020 1256   PHURINE 7.0 06/20/2020 1256   GLUCOSEU NEGATIVE 06/20/2020 1256   HGBUR MODERATE (A) 06/20/2020 1256   BILIRUBINUR NEGATIVE 06/20/2020 1256   KETONESUR NEGATIVE 06/20/2020 1256   PROTEINUR NEGATIVE 06/20/2020 1256   NITRITE NEGATIVE 06/20/2020 1256   LEUKOCYTESUR SMALL (A) 06/20/2020 1256   Dr. Tobie Poet [facility]  If 7PM-7AM, please contact overnight-coverage provider If 7AM-7PM, please contact day coverage provider [email or web address]  03/23/2021, 10:36 PM

## 2021-03-23 NOTE — Hospital Course (Signed)
66 year old female with no prior medical diagnosis who presents emergency department for chief concerns of left-sided chest pain.  Vitals in the emergency department showed temperature of 98.5, respiration rate of 16, heart rate of 54, initial blood pressure 136/86, SPO2 of 99% on room air.  Labs in the emergency department showed serum sodium 139, potassium 3.5, chloride of 107, bicarb 21, BUN of 20, serum creatinine of 0.80, nonfasting blood glucose 111, WBC 9.7, hemoglobin 13.9, platelets of 387.  High sensitive troponin was 2 and increased to 554.

## 2021-03-23 NOTE — ED Triage Notes (Signed)
Pt via POV from home. Pt c/o L side chest pressure that started 20 mins PTA, pt states she also became SOB. Denies NVD. Denies cardiac hx. Pt is A&Ox4 and NAD

## 2021-03-23 NOTE — ED Provider Notes (Signed)
[facility] Provider Note    Event Date/Time   First MD Initiated Contact with Patient 03/23/21 1819     (approximate)   History   Chest Pain   HPI  Cindy [name] is a 66 y.o. female with no significant past medical history who comes the ED complaining of left chest pressure that started abruptly at about 5:30 PM today.  She was at rest at the time.  No exertional symptoms or pleuritic symptoms.  No shortness of breath diaphoresis or vomiting.  Pain radiates into the left arm.  Never had pain like this before.     Physical Exam   Triage Vital Signs: ED Triage Vitals [03/23/21 1748]  Enc Vitals Group     BP 136/86     Pulse Rate (!) 54     Resp 16     Temp 98.5 F (36.9 C)     Temp Source Oral     SpO2 99 %     Weight 120 lb (54.4 kg)     Height 5\' 2"  (1.575 m)     Head Circumference      Peak Flow      Pain Score 7     Pain Loc      Pain Edu?      Excl. in GC?     Most recent vital signs: Vitals:   03/23/21 2230 03/23/21 2300  BP: 106/74 (!) 142/90  Pulse: (!) 49 (!) 50  Resp: 14 12  Temp:    SpO2: 100% 99%     General: Awake, no distress.  CV:  Good peripheral perfusion.  Normal pulses Resp:  Normal effort.  Clear to auscultation bilaterally Abd:  No distention.  Soft nontender Other:  No peripheral edema   ED Results / Procedures / Treatments   Labs (all labs ordered are listed, but only abnormal results are displayed) Labs Reviewed  BASIC METABOLIC PANEL - Abnormal; Notable for the following components:      Result Value   CO2 21 (*)    Glucose, Bld 111 (*)    All other components within normal limits  TROPONIN I (HIGH SENSITIVITY) - Abnormal; Notable for the following components:   Troponin I (High Sensitivity) 554 (*)    All other components within normal limits  RESP PANEL BY RT-PCR (FLU A&B, COVID) ARPGX2  CBC  PHOSPHORUS  MAGNESIUM  TSH  BRAIN NATRIURETIC PEPTIDE  PROTIME-INR  APTT  CBC  HEPARIN LEVEL  (UNFRACTIONATED)  HIV ANTIBODY (ROUTINE TESTING W REFLEX)  BASIC METABOLIC PANEL  LIPID PANEL  TROPONIN I (HIGH SENSITIVITY)  TROPONIN I (HIGH SENSITIVITY)     EKG  Interpreted by me Sinus bradycardia, rate of 51.  Normal axis and intervals.  Poor R wave progression.  ST segments and T waves.  1 PVC on the strip.  No evidence of posterior or right heart STEMI.   RADIOLOGY Chest x-ray viewed and interpreted by me, appears unremarkable.  Radiology report reviewed    PROCEDURES:  Critical Care performed: Yes, see critical care procedure note(s)  .Critical Care Performed by: 03/25/21, MD Authorized by: Sharman Cheek, MD   Critical care provider statement:    Critical care time (minutes):  35   Critical care time was exclusive of:  Separately billable procedures and treating other patients   Critical care was necessary to treat or prevent imminent or life-threatening deterioration of the following conditions:  Cardiac failure   Critical care was time spent  personally by me on the following activities:  Development of treatment plan with patient or surrogate, discussions with consultants, evaluation of patient's response to treatment, examination of patient, obtaining history from patient or surrogate, ordering and performing treatments and interventions, ordering and review of laboratory studies, ordering and review of radiographic studies, pulse oximetry, re-evaluation of patient's condition and review of old charts   Care discussed with: admitting provider   Comments:        .1-3 Lead EKG Interpretation Performed by: Sharman Cheek, MD Authorized by: Sharman Cheek, MD     Interpretation: abnormal     ECG rate:  50   ECG rate assessment: bradycardic     Rhythm: sinus rhythm     Ectopy: none     Conduction: normal     MEDICATIONS ORDERED IN ED: Medications  nitroGLYCERIN (NITROSTAT) SL tablet 0.4 mg (0.4 mg Sublingual Given 03/23/21 2214)  heparin  ADULT infusion 100 units/mL (25000 units/227mL) (650 Units/hr Intravenous New Bag/Given 03/23/21 2144)  acetaminophen (TYLENOL) tablet 650 mg (has no administration in time range)    Or  acetaminophen (TYLENOL) suppository 650 mg (has no administration in time range)  ondansetron (ZOFRAN) tablet 4 mg (has no administration in time range)    Or  ondansetron (ZOFRAN) injection 4 mg (has no administration in time range)  melatonin tablet 5 mg (has no administration in time range)  aspirin chewable tablet 324 mg (324 mg Oral Given 03/23/21 1851)  heparin bolus via infusion 3,300 Units (3,300 Units Intravenous Bolus from Bag 03/23/21 2144)     IMPRESSION / MDM / ASSESSMENT AND PLAN / ED COURSE  I reviewed the triage vital signs and the nursing notes.                              Differential diagnosis includes, but is not limited to, non-STEMI, GERD, pneumothorax, chest wall pain   The patient is on the cardiac monitor to evaluate for evidence of arrhythmia and/or significant heart rate changes.  Patient presents with anterior chest pain without associated symptoms.  Somewhat nonspecific in character.  Initial vitals, EKG, chest x-ray unremarkable.  Exam unremarkable.  Will obtain serial troponins while giving aspirin and nitroglycerin.  Clinical Course as of 03/23/21 2314  Wynelle Link Mar 23, 2021  2044 Patient reports the pain is resolved.  With atypical nature of the pain and lack of risk factors, she is low risk by heart score.  She prefers to be discharged to follow-up with PCP/cardiology for additional evaluation.  If repeat troponin is normal, she will not require admission and will be stable for discharge [PS]  2115 Repeat troponin has markedly increased to 554 consistent with NSTEMI.  We will need to start heparin and admit for further cardiac work-up. [PS]    Clinical Course User Index [PS] Sharman Cheek, MD     FINAL CLINICAL IMPRESSION(S) / ED DIAGNOSES   Final diagnoses:   NSTEMI (non-ST elevated myocardial infarction) [facility])     Rx / DC Orders   ED Discharge Orders     None        Note:  This document was prepared using Dragon voice recognition software and may include unintentional dictation errors.   Sharman Cheek, MD 03/23/21 2314

## 2021-03-23 NOTE — Assessment & Plan Note (Signed)
-   Continue heparin GTT - Repeat troponin

## 2021-03-23 NOTE — Progress Notes (Signed)
ANTICOAGULATION CONSULT NOTE  Pharmacy Consult for heparin infusion Indication: chest pain/ACS  No Known Allergies  Patient Measurements: Height: 5\' 2"  (157.5 cm) Weight: 54.4 kg (120 lb) IBW/kg (Calculated) : 50.1 Heparin Dosing Weight: 54.4 kg  Vital Signs: Temp: 98.5 F (36.9 C) (01/29 1748) Temp Source: Oral (01/29 1748) BP: 116/101 (01/29 2030) Pulse Rate: 47 (01/29 2030)  Labs: Recent Labs    03/23/21 1752 03/23/21 2004  HGB 13.9  --   HCT 42.8  --   PLT 387  --   CREATININE 0.80  --   TROPONINIHS 7 554*    Estimated Creatinine Clearance: 56.9 mL/min (by C-G formula based on SCr of 0.8 mg/dL).   Medical History: History reviewed. No pertinent past medical history.  Medications:  Per chart review, no anticoagulation prior to admission  Assessment: 66 yo female c/o L side chest pressure and SOB. Pharmacy has been consulted for heparin dosing/monitoring.   Baseline labs: H&H and Plt WNL; aPTT and PT-INR pending  Goal of Therapy:  Heparin level 0.3-0.7 units/ml Monitor platelets by anticoagulation protocol: Yes   Plan:  Give 3300 units bolus x 1 Start heparin infusion at 650 units/hr Check anti-Xa level in 6 hours and daily while on heparin Continue to monitor H&H and platelets  Jadalynn Burr O Kadisha Goodine 03/23/2021,9:19 PM

## 2021-03-24 ENCOUNTER — Encounter: Payer: Self-pay | Admitting: Internal Medicine

## 2021-03-24 ENCOUNTER — Encounter
Admission: EM | Disposition: A | Discharge status: Home / Self Care | Payer: Self-pay | Source: Home / Self Care | Attending: Internal Medicine

## 2021-03-24 ENCOUNTER — Observation Stay (HOSPITAL_COMMUNITY)
Admit: 2021-03-24 | Discharge: 2021-03-24 | Disposition: A | Discharge status: Home / Self Care | Payer: BC Managed Care – PPO | Attending: Internal Medicine | Admitting: Internal Medicine

## 2021-03-24 ENCOUNTER — Other Ambulatory Visit: Payer: Self-pay

## 2021-03-24 DIAGNOSIS — Z8249 Family history of ischemic heart disease and other diseases of the circulatory system: Secondary | ICD-10-CM | POA: Diagnosis not present

## 2021-03-24 DIAGNOSIS — R778 Other specified abnormalities of plasma proteins: Secondary | ICD-10-CM

## 2021-03-24 DIAGNOSIS — R079 Chest pain, unspecified: Secondary | ICD-10-CM

## 2021-03-24 DIAGNOSIS — E785 Hyperlipidemia, unspecified: Secondary | ICD-10-CM | POA: Diagnosis present

## 2021-03-24 DIAGNOSIS — I214 Non-ST elevation (NSTEMI) myocardial infarction: Principal | ICD-10-CM

## 2021-03-24 DIAGNOSIS — Z7983 Long term (current) use of bisphosphonates: Secondary | ICD-10-CM | POA: Diagnosis not present

## 2021-03-24 DIAGNOSIS — I428 Other cardiomyopathies: Secondary | ICD-10-CM | POA: Diagnosis not present

## 2021-03-24 DIAGNOSIS — I5021 Acute systolic (congestive) heart failure: Secondary | ICD-10-CM | POA: Diagnosis present

## 2021-03-24 DIAGNOSIS — Z20822 Contact with and (suspected) exposure to covid-19: Secondary | ICD-10-CM | POA: Diagnosis present

## 2021-03-24 HISTORY — PX: LEFT HEART CATH AND CORONARY ANGIOGRAPHY: CATH118249

## 2021-03-24 LAB — TROPONIN I (HIGH SENSITIVITY)
Troponin I (High Sensitivity): 2246 ng/L (ref <18)
Troponin I (High Sensitivity): 2694 ng/L (ref <18)

## 2021-03-24 LAB — HIV ANTIBODY (ROUTINE TESTING W REFLEX): HIV Screen 4th Generation wRfx: NONREACTIVE

## 2021-03-24 LAB — LIPID PANEL
Cholesterol: 181 mg/dL (ref 0–200)
HDL: 47 mg/dL (ref >40)
LDL Cholesterol: 117 mg/dL — ABNORMAL HIGH (ref 0–99)
Total CHOL/HDL Ratio: 3.9 RATIO
Triglycerides: 87 mg/dL (ref <150)
VLDL: 17 mg/dL (ref 0–40)

## 2021-03-24 LAB — BASIC METABOLIC PANEL
Anion gap: 7 (ref 5–15)
BUN: 23 mg/dL (ref 8–23)
CO2: 23 mmol/L (ref 22–32)
Calcium: 9.1 mg/dL (ref 8.9–10.3)
Chloride: 109 mmol/L (ref 98–111)
Creatinine, Ser: 0.8 mg/dL (ref 0.44–1.00)
GFR, Estimated: >60 mL/min (ref >60)
Glucose, Bld: 98 mg/dL (ref 70–99)
Potassium: 3.6 mmol/L (ref 3.5–5.1)
Sodium: 139 mmol/L (ref 135–145)

## 2021-03-24 LAB — ECHOCARDIOGRAM COMPLETE
AR max vel: 2.77 cm2
AV Area VTI: 2.95 cm2
AV Area mean vel: 2.59 cm2
AV Mean grad: 3 mmHg
AV Peak grad: 5.7 mmHg
Ao pk vel: 1.19 m/s
Area-P 1/2: 3.03 cm2
Calc EF: 44.2 %
Height: 62 in
MV VTI: 3.46 cm2
S' Lateral: 2.7 cm
Single Plane A2C EF: 50.8 %
Single Plane A4C EF: 41.9 %
Weight: 1920 oz

## 2021-03-24 LAB — CBC
HCT: 39.4 % (ref 36.0–46.0)
Hemoglobin: 12.9 g/dL (ref 12.0–15.0)
MCH: 31.5 pg (ref 26.0–34.0)
MCHC: 32.7 g/dL (ref 30.0–36.0)
MCV: 96.3 fL (ref 80.0–100.0)
Platelets: 346 10*3/uL (ref 150–400)
RBC: 4.09 MIL/uL (ref 3.87–5.11)
RDW: 12.3 % (ref 11.5–15.5)
WBC: 9.9 10*3/uL (ref 4.0–10.5)
nRBC: 0 % (ref 0.0–0.2)

## 2021-03-24 LAB — HEPARIN LEVEL (UNFRACTIONATED)
Heparin Unfractionated: 0.25 IU/mL — ABNORMAL LOW (ref 0.30–0.70)
Heparin Unfractionated: 0.36 IU/mL (ref 0.30–0.70)

## 2021-03-24 SURGERY — LEFT HEART CATH AND CORONARY ANGIOGRAPHY
Anesthesia: Moderate Sedation

## 2021-03-24 MED ORDER — LIDOCAINE HCL 1 % IJ SOLN
INTRAMUSCULAR | Status: AC
  Filled 2021-03-24: qty 20

## 2021-03-24 MED ORDER — ASPIRIN 81 MG PO CHEW
CHEWABLE_TABLET | ORAL | Status: AC
  Filled 2021-03-24: qty 1

## 2021-03-24 MED ORDER — MIDAZOLAM HCL 2 MG/2ML IJ SOLN
INTRAMUSCULAR | Status: AC
  Filled 2021-03-24: qty 2

## 2021-03-24 MED ORDER — SODIUM CHLORIDE 0.9 % IV SOLN: 250.0000 mL | INTRAVENOUS | Status: DC | PRN

## 2021-03-24 MED ORDER — SODIUM CHLORIDE 0.9% FLUSH: 3.0000 mL | INTRAVENOUS | Status: DC | PRN

## 2021-03-24 MED ORDER — FENTANYL CITRATE (PF) 100 MCG/2ML IJ SOLN
INTRAMUSCULAR | Status: AC
  Filled 2021-03-24: qty 2

## 2021-03-24 MED ORDER — HEPARIN (PORCINE) IN NACL 1000-0.9 UT/500ML-% IV SOLN
INTRAVENOUS | Status: AC
  Filled 2021-03-24: qty 1000

## 2021-03-24 MED ORDER — VERAPAMIL HCL 2.5 MG/ML IV SOLN
INTRAVENOUS | Status: AC
  Filled 2021-03-24: qty 2

## 2021-03-24 MED ORDER — MIDAZOLAM HCL 2 MG/2ML IJ SOLN
INTRAMUSCULAR | Status: DC | PRN
  Administered 2021-03-24: 1 mg via INTRAVENOUS

## 2021-03-24 MED ORDER — ASPIRIN 81 MG PO CHEW
81.0000 mg | CHEWABLE_TABLET | Freq: Every day | ORAL | Status: DC
  Administered 2021-03-24 – 2021-03-25 (×2): 81 mg via ORAL
  Filled 2021-03-24 (×2): qty 1

## 2021-03-24 MED ORDER — METOPROLOL SUCCINATE ER 25 MG PO TB24: 25.0000 mg | ORAL_TABLET | Freq: Every day | ORAL | Status: DC

## 2021-03-24 MED ORDER — LIDOCAINE HCL (PF) 1 % IJ SOLN
INTRAMUSCULAR | Status: DC | PRN
  Administered 2021-03-24: 2 mL

## 2021-03-24 MED ORDER — ASPIRIN 81 MG PO CHEW
81.0000 mg | CHEWABLE_TABLET | ORAL | Status: AC
  Administered 2021-03-24: 81 mg via ORAL

## 2021-03-24 MED ORDER — HEPARIN SODIUM (PORCINE) 1000 UNIT/ML IJ SOLN
INTRAMUSCULAR | Status: AC
  Filled 2021-03-24: qty 10

## 2021-03-24 MED ORDER — ROSUVASTATIN CALCIUM 10 MG PO TABS
20.0000 mg | ORAL_TABLET | Freq: Every evening | ORAL | Status: DC
  Administered 2021-03-24: 20 mg via ORAL
  Filled 2021-03-24: qty 2
  Filled 2021-03-24: qty 1
  Filled 2021-03-24: qty 2

## 2021-03-24 MED ORDER — SODIUM CHLORIDE 0.9 % IV SOLN: INTRAVENOUS | Status: DC

## 2021-03-24 MED ORDER — FENTANYL CITRATE (PF) 100 MCG/2ML IJ SOLN
INTRAMUSCULAR | Status: DC | PRN
  Administered 2021-03-24: 25 ug via INTRAVENOUS

## 2021-03-24 MED ORDER — VERAPAMIL HCL 2.5 MG/ML IV SOLN
INTRAVENOUS | Status: DC | PRN
  Administered 2021-03-24: 2.5 mg via INTRA_ARTERIAL

## 2021-03-24 MED ORDER — HEPARIN SODIUM (PORCINE) 1000 UNIT/ML IJ SOLN
INTRAMUSCULAR | Status: DC | PRN
  Administered 2021-03-24: 3000 [IU] via INTRAVENOUS

## 2021-03-24 MED ORDER — SODIUM CHLORIDE 0.9 % IV SOLN: INTRAVENOUS | Status: AC

## 2021-03-24 MED ORDER — ENOXAPARIN SODIUM 40 MG/0.4ML IJ SOSY
40.0000 mg | PREFILLED_SYRINGE | INTRAMUSCULAR | Status: DC
  Administered 2021-03-25: 40 mg via SUBCUTANEOUS
  Filled 2021-03-24 (×3): qty 0.4

## 2021-03-24 MED ORDER — SODIUM CHLORIDE 0.9% FLUSH
3.0000 mL | Freq: Two times a day (BID) | INTRAVENOUS | Status: DC
  Administered 2021-03-24 – 2021-03-25 (×2): 3 mL via INTRAVENOUS

## 2021-03-24 MED ORDER — IOHEXOL 300 MG/ML  SOLN
INTRAMUSCULAR | Status: DC | PRN
  Administered 2021-03-24: 52 mL

## 2021-03-24 MED ORDER — SODIUM CHLORIDE 0.9% FLUSH: 3.0000 mL | Freq: Two times a day (BID) | INTRAVENOUS | Status: DC

## 2021-03-24 MED ORDER — HEPARIN BOLUS VIA INFUSION
800.0000 [IU] | Freq: Once | INTRAVENOUS | Status: AC
  Administered 2021-03-24: 800 [IU] via INTRAVENOUS
  Filled 2021-03-24: qty 800

## 2021-03-24 MED ORDER — HEPARIN (PORCINE) IN NACL 1000-0.9 UT/500ML-% IV SOLN
INTRAVENOUS | Status: DC | PRN
  Administered 2021-03-24: 1000 mL

## 2021-03-24 SURGICAL SUPPLY — 11 items
CATH INFINITI 5 FR JL3.5 (CATHETERS) ×1 IMPLANT
CATH OPTITORQUE JACKY 4.0 5F (CATHETERS) ×1 IMPLANT
DEVICE RAD TR BAND REGULAR (VASCULAR PRODUCTS) ×1 IMPLANT
DRAPE BRACHIAL (DRAPES) ×1 IMPLANT
GLIDESHEATH SLEND SS 6F .021 (SHEATH) ×1 IMPLANT
GUIDEWIRE INQWIRE 1.5J.035X260 (WIRE) IMPLANT
INQWIRE 1.5J .035X260CM (WIRE) ×2
PACK CARDIAC CATH (CUSTOM PROCEDURE TRAY) ×2 IMPLANT
PROTECTION STATION PRESSURIZED (MISCELLANEOUS) ×2
SET ATX SIMPLICITY (MISCELLANEOUS) ×1 IMPLANT
STATION PROTECTION PRESSURIZED (MISCELLANEOUS) IMPLANT

## 2021-03-24 NOTE — Progress Notes (Signed)
*  PRELIMINARY RESULTS* Echocardiogram 2D Echocardiogram has been performed.  Sherrie Sport 03/24/2021, 12:00 PM

## 2021-03-24 NOTE — Assessment & Plan Note (Signed)
LDL 117 on lipid panel.  Started on rosuvastatin.

## 2021-03-24 NOTE — Hospital Course (Signed)
66 year old female with no prior medical history who presented to the ED on evening of 03/23/2021 for evaluation of left-sided chest pain radiating to her left arm.  Onset occurred at rest.  Pain did not improve until she received nitroglycerin in the ED.  She has no significant risk factors for cardiovascular disease, but does report family history of CAD.  She denies being overly stressed but she and family are planning to move to [place] soon and buying a new home after her husband got a job there.  She does not feel stressed by this however.  Troponin was trended and significantly elevated above 2600.  She had some ST segment changes on her EKG as well.  She was started on IV heparin and cardiology consulted.  Patient was taken t for cardiac cath on the afternoon of 1/30.  It showed normal coronary arteries with no evidence of obstructive disease.  Mildly reduced LV systolic function with EF 45 to 50%.

## 2021-03-24 NOTE — Plan of Care (Signed)

## 2021-03-24 NOTE — Progress Notes (Signed)
Progress Note   Patient: Cindy [name]:[id number] DOB: [birth year] DOA: 03/23/2021     0 DOS: the patient was seen and examined on 03/24/2021   Brief hospital course:  66 year old female with no prior medical history who presented to the ED on evening of 03/23/2021 for evaluation of left-sided chest pain radiating to her left arm.  Onset occurred at rest.  Pain did not improve until she received nitroglycerin in the ED.  She has no significant risk factors for cardiovascular disease, but does report family history of CAD.  She denies being overly stressed but she and family are planning to move to [place] soon and buying a new home after her husband got a job there.  She does not feel stressed by this however.  Troponin was trended and significantly elevated above 2600.  She had some ST segment changes on her EKG as well.  She was started on IV heparin and cardiology consulted.  Patient was taken t for cardiac cath on the afternoon of 1/30.  It showed normal coronary arteries with no evidence of obstructive disease.  Mildly reduced LV systolic function with EF 45 to 50%.     Assessment and Plan: * NSTEMI (non-ST elevated myocardial infarction) (HCC)- (present on admission) Patient presented with chest pain, ST changes on EKG and troponin rising to above 2600. -- Cardiology consulted --Continue IV heparin -- Cardiac cath today showed no obstructive coronary disease, mildly reduced EF of 45 to 50%.  Echo did show some anteroseptal wall motion abnormality -- Started on aspirin, Toprol and Crestor --As needed sublingual nitro Cardiology suspects chest pain due to small vessel disease versus a variant form of a stress-induced cardiomyopathy  Elevated troponin- (present on admission) Troponin trended and increased above 2600. Trend until declining.  Chest pain- (present on admission) Present on admission with left-sided chest pain radiating to left arm.  Management as outlined under  NSTEMI  Sinus bradycardia by electrocardiogram- (present on admission) Cardiology following.  Heart rates have been in the 50s and 60s today  Hyperlipidemia- (present on admission) LDL 117 on lipid panel.  Started on rosuvastatin.        Subjective: Patient seen in the ED holding for a bed, daughter at bedside.  She reports ongoing mild chest pain about 2-3 in severity.  She asks if she may have cardiac syndrome X because she does not have any medical history is very healthy.  Cardiology arrived to bedside during encounter.  Patient anxious to go to cath lab to determine the cause of her chest pain.  She has no other acute complaints.  Physical Exam: Vitals:   03/24/21 1510 03/24/21 1615 03/24/21 1630 03/24/21 1645  BP: 127/71 130/65 135/69 137/72  Pulse: 67 (!) 56 (!) 56 61  Resp: 18 15 16 14   Temp: 97.7 F (36.5 C)     TempSrc: Oral     SpO2: 100% 100% 100% 100%  Weight:      Height:       General exam: awake, alert, no acute distress HEENT: atraumatic, clear conjunctiva, anicteric sclera, moist mucus membranes, hearing grossly normal  Respiratory system: normal respiratory effort, on room air, lungs clear. Cardiovascular system: normal S1/S2, RRR, no JVD, murmurs, rubs, gallops, no pedal edema.   Central nervous system: A&O x3. no gross focal neurologic deficits, normal speech Extremities: moves all, no edema, normal tone Skin: dry, intact, normal temperature, normal color, No rashes, lesions or ulcers seen on visualized skin Psychiatry: Anxious mood, congruent affect,  judgement and insight appear normal   Data Reviewed:  Labs reviewed and notable for troponin 2694, LDL cholesterol 117 otherwise normal lipid panel.  CBC completely normal.  BMP completely normal.  Echocardiogram -  1. Left ventricular ejection fraction, by estimation, is 40 to 45%. The  left ventricle has mildly decreased function. The left ventricle  demonstrates regional wall motion abnormalities  (see scoring  diagram/findings for description). Left ventricular  diastolic parameters are consistent with Grade I diastolic dysfunction  (impaired relaxation). There is akinesis of the left ventricular, mid  anteroseptal wall.   2. Right ventricular systolic function is normal. The right ventricular  size is normal. There is normal pulmonary artery systolic pressure.   3. The mitral valve is normal in structure. No evidence of mitral valve  regurgitation. No evidence of mitral stenosis.   4. The aortic valve is normal in structure. Aortic valve regurgitation is  not visualized. Aortic valve sclerosis/calcification is present, without  any evidence of aortic stenosis.   5. The inferior vena cava is normal in size with <50% respiratory  variability, suggesting right atrial pressure of 8 mmHg.   Cardiac cath -  1.  No normal coronary arteries with no evidence of obstructive disease.  Possible faint collaterals from the distal PL branch to small left-sided branches. 2.  Mildly reduced LV systolic function.  Wall motion could not be evaluated.  There was anteroseptal wall motion abnormality on echocardiogram.  Mildly elevated left ventricular end-diastolic pressure.   Family Communication: Daughter at bedside on rounds  Disposition:  Status is: Inpatient Remains inpatient appropriate because: severity of illness remaining on IV heparin  Planned Discharge Destination: Home          Planned Discharge Destination: Home  Possible discharge tomorrow if clinically improved and stable.          Time spent: 35 minutes  Author: Pennie Banter, DO 03/24/2021 5:04 PM  For on call review [email or web address].

## 2021-03-24 NOTE — Assessment & Plan Note (Signed)
Troponin trended and increased above 2600. Trend until declining.

## 2021-03-24 NOTE — Assessment & Plan Note (Signed)
Cardiology following.  Heart rates have been in the 50s and 60s today

## 2021-03-24 NOTE — Assessment & Plan Note (Addendum)
Patient presented with chest pain, ST changes on EKG and troponin rising to above 2600. -- Cardiology consulted --Treated with IV heparin -- Cardiac cath showed no obstructive coronary disease, mildly reduced EF of 45 to 50%.   -- Echo did show some anteroseptal wall motion abnormality -- Started on aspirin, Toprol and Crestor -- As needed sublingual nitro Cardiology suspects chest pain due to small vessel disease versus a variant form of a stress-induced cardiomyopathy

## 2021-03-24 NOTE — Consult Note (Signed)
Cardiology Consultation:   [name] ID: Cindy [name] MRN: [id number]; DOB: [birth year]  Admit date: 03/23/2021 Date of Consult: 03/24/2021  PCP:  Jenell Milliner, MD   [facility] Providers Cardiologist:  None   new Kirke Corin)     [name] Profile:   Cindy [name] is a 66 y.o. female with no significant past medical history who is being seen 03/24/2021 for Cindy evaluation of non-ST elevation myocardial infarction at Cindy request of Dr. Denton Lank.  History of Present Illness:   Cindy [name] is a pleasant 66 year old female with no prior past medical history.  She does not take any medications and she is a lifelong non-smoker.  She presented to Cindy ED for evaluation of left side chest pain which was described as an elephant sitting on her chest which lasted more than 30 minutes.  Troponin was elevated to 554 and subsequently increased to 2600.  Cindy [name] has no history of recent viral illnesses.  She reports increased stress as her husband got a job in [place] and they might be moving.  She works at [employer]. She was started on heparin drip.  Her chest pain resolved but just came back but is now minor and not as intense as when she initially presented.  Cindy pain radiates to Cindy left arm and improves with nitroglycerin. She reports family history of coronary artery disease.   History reviewed. No pertinent past medical history.  Past Surgical History:  Procedure Laterality Date   APPENDECTOMY     CESAREAN SECTION     TONSILLECTOMY       Home Medications:  Prior to Admission medications   Medication Sig Start Date End Date Taking? Authorizing Provider  alendronate (FOSAMAX) 70 MG tablet Take 70 mg by mouth once a week. 03/13/21  Yes [provider]    Inpatient Medications: Scheduled Meds:  Continuous Infusions:  heparin 750 Units/hr (03/24/21 0657)   PRN Meds: acetaminophen **OR** acetaminophen, melatonin, nitroGLYCERIN, ondansetron **OR** ondansetron (ZOFRAN)  IV  Allergies:   No Known Allergies  Social History:   Social History   Socioeconomic History   Marital status: Married    Spouse name: Not on file   Number of children: Not on file   Years of education: Not on file   Highest education level: Not on file  Occupational History   Not on file  Tobacco Use   Smoking status: Never   Smokeless tobacco: Never  Vaping Use   Vaping Use: Never used  Substance and Sexual Activity   Alcohol use: Yes   Drug use: Never   Sexual activity: Yes    Partners: Male  Other Topics Concern   Not on file  Social History Narrative   Not on file   Social Determinants of Health   Financial Resource Strain: Not on file  Food Insecurity: Not on file  Transportation Needs: Not on file  Physical Activity: Not on file  Stress: Not on file  Social Connections: Not on file  Intimate Partner Violence: Not on file    Family History:    Family History  Problem Relation Age of Onset   Hypertension Father    Heart disease Father      ROS:  Please see Cindy history of present illness.   All other ROS reviewed and negative.     Physical Exam/Data:   Vitals:   03/24/21 0500 03/24/21 0600 03/24/21 0900 03/24/21 0943  BP: 117/62 (!) 129/116  126/73  Pulse: (!) 49 (!)  53 62 64  Resp: 15 16 16 14   Temp:      TempSrc:      SpO2: 98% 97% 100% 98%  Weight:      Height:       No intake or output data in Cindy 24 hours ending 03/24/21 1139 Last 3 Weights 03/23/2021 06/20/2020 04/12/2018  Weight (lbs) 120 lb 149 lb 14.6 oz 150 lb  Weight (kg) 54.432 kg 68 kg 68.04 kg     Body mass index is 21.95 kg/m.  General:  Well nourished, well developed, in no acute distress HEENT: normal Neck: no JVD Vascular: No carotid bruits; Distal pulses 2+ bilaterally Cardiac:  normal S1, S2; RRR; no murmur  Lungs:  clear to auscultation bilaterally, no wheezing, rhonchi or rales  Abd: soft, nontender, no hepatomegaly  Ext: no edema Musculoskeletal:  No  deformities, BUE and BLE strength normal and equal Skin: warm and dry  Neuro:  CNs 2-12 intact, no focal abnormalities noted Psych:  Normal affect   EKG:  Cindy EKG was personally reviewed and demonstrates: Sinus bradycardia with minor ST depression in 1 and aVL and poor R wave progression in Cindy precordial leads Telemetry:  Telemetry was personally reviewed and demonstrates: PVCs  Relevant CV Studies: Echocardiogram is pending  Laboratory Data:  High Sensitivity Troponin:   Recent Labs  Lab 03/23/21 0013 03/23/21 1752 03/23/21 2004 03/24/21 0100  TROPONINIHS 2,246* 7 554* 2,694*     Chemistry Recent Labs  Lab 03/23/21 1752 03/23/21 2137 03/24/21 0512  NA 139  --  139  K 3.5  --  3.6  CL 107  --  109  CO2 21*  --  23  GLUCOSE 111*  --  98  BUN 20  --  23  CREATININE 0.80  --  0.80  CALCIUM 9.5  --  9.1  MG  --  2.3  --   GFRNONAA >60  --  >60  ANIONGAP 11  --  7    No results for input(s): PROT, ALBUMIN, AST, ALT, ALKPHOS, BILITOT in Cindy last 168 hours. Lipids  Recent Labs  Lab 03/24/21 0512  CHOL 181  TRIG 87  HDL 47  LDLCALC 117*  CHOLHDL 3.9    Hematology Recent Labs  Lab 03/23/21 1752 03/24/21 0512  WBC 9.7 9.9  RBC 4.54 4.09  HGB 13.9 12.9  HCT 42.8 39.4  MCV 94.3 96.3  MCH 30.6 31.5  MCHC 32.5 32.7  RDW 12.3 12.3  PLT 387 346   Thyroid  Recent Labs  Lab 03/23/21 2137  TSH 1.126    BNP Recent Labs  Lab 03/23/21 2137  BNP 19.3    DDimer No results for input(s): DDIMER in Cindy last 168 hours.   Radiology/Studies:  DG Chest 2 View  Result Date: 03/23/2021 CLINICAL DATA:  Acute onset left-sided chest pain 20 minutes ago. Shortness of breath. EXAM: CHEST - 2 VIEW COMPARISON:  None. FINDINGS: Cindy heart size and mediastinal contours are within normal limits. Both lungs are clear. Cindy visualized skeletal structures are unremarkable. IMPRESSION: No active cardiopulmonary disease. Electronically Signed   By: 03/25/2021 M.D.   On:  03/23/2021 18:16     Assessment and Plan:   Non-ST elevation myocardial infarction: Classic presentation although she does not have many risk factors for ischemic heart disease.  Troponin is significantly elevated with ST changes on EKG.  I agree with unfractionated heparin.  Recommend adding aspirin and a high potency statin.  I discussed different  management options with Cindy [name] and recommend proceeding with left heart catheterization possible PCI.  I discussed Cindy procedure in details as well as risk and benefits.  We will plan on proceeding later today in Cindy afternoon if Cindy schedule allows.  Keep n.p.o. for now. Hyperlipidemia: Lipid profile showed an LDL of 117.  I added rosuvastatin.   Risk Assessment/Risk Scores:     TIMI Risk Score for Unstable Angina or Non-ST Elevation MI:   Cindy [name]'s TIMI risk score is 2, which indicates a 8% risk of all cause mortality, new or recurrent myocardial infarction or need for urgent revascularization in Cindy next 14 days.{   For questions or updates, please contact [facility] Please consult [email or web address] for contact info under    Signed, Lorine Bears, MD  03/24/2021 11:39 AM

## 2021-03-24 NOTE — Progress Notes (Signed)
ANTICOAGULATION CONSULT NOTE  Pharmacy Consult for heparin infusion Indication: chest pain/ACS  No Known Allergies  Patient Measurements: Height: 5\' 2"  (157.5 cm) Weight: 54.4 kg (120 lb) IBW/kg (Calculated) : 50.1 Heparin Dosing Weight: 54.4 kg  Vital Signs: BP: 100/69 (01/30 0300) Pulse Rate: 62 (01/30 0300)  Labs: Recent Labs    03/23/21 1752 03/23/21 2004 03/23/21 2116 03/24/21 0100 03/24/21 0512  HGB 13.9  --   --   --  12.9  HCT 42.8  --   --   --  39.4  PLT 387  --   --   --  346  APTT  --   --  >200*  --   --   LABPROT  --   --  13.4  --   --   INR  --   --  1.0  --   --   HEPARINUNFRC  --   --   --   --  0.25*  CREATININE 0.80  --   --   --  0.80  TROPONINIHS 7 554*  --  2,694*  --      Estimated Creatinine Clearance: 56.9 mL/min (by C-G formula based on SCr of 0.8 mg/dL).   Medical History: History reviewed. No pertinent past medical history.  Medications:  Per chart review, no anticoagulation prior to admission  Assessment: 66 yo female c/o L side chest pressure and SOB. Pharmacy has been consulted for heparin dosing/monitoring.   Baseline labs: H&H and Plt WNL; aPTT and PT-INR pending  Goal of Therapy:  Heparin level 0.3-0.7 units/ml Monitor platelets by anticoagulation protocol: Yes  1/30 0512 HL 0.25, subtherapeutic   Plan:  Bolus 800 units x 1 Increase heparin infusion to 750 units/hr Recheck HL in 6 hours after rate change Continue to monitor H&H and platelets  2/30, PharmD, [facility] 03/24/2021 6:51 AM

## 2021-03-24 NOTE — Assessment & Plan Note (Signed)
Present on admission with left-sided chest pain radiating to left arm.  Management as outlined under NSTEMI

## 2021-03-25 ENCOUNTER — Encounter: Payer: Self-pay | Admitting: Cardiovascular Disease

## 2021-03-25 DIAGNOSIS — I214 Non-ST elevation (NSTEMI) myocardial infarction: Secondary | ICD-10-CM | POA: Diagnosis not present

## 2021-03-25 DIAGNOSIS — I428 Other cardiomyopathies: Secondary | ICD-10-CM | POA: Diagnosis not present

## 2021-03-25 MED ORDER — NITROGLYCERIN 0.4 MG SL SUBL: 0.4000 mg | SUBLINGUAL_TABLET | SUBLINGUAL | 0 refills | Status: DC | PRN

## 2021-03-25 MED ORDER — ROSUVASTATIN CALCIUM 20 MG PO TABS: 20.0000 mg | ORAL_TABLET | Freq: Every evening | ORAL | 2 refills | Status: DC

## 2021-03-25 MED ORDER — LOSARTAN POTASSIUM 25 MG PO TABS: 12.5000 mg | ORAL_TABLET | Freq: Every day | ORAL | Status: DC

## 2021-03-25 MED ORDER — ASPIRIN 81 MG PO CHEW: 81.0000 mg | CHEWABLE_TABLET | Freq: Every day | ORAL | 2 refills | Status: DC

## 2021-03-25 NOTE — TOC CM/SW Note (Signed)
Patient has orders to discharge home today. Chart reviewed. PCP is Yvonne Luyando, MD. On room air. No wounds. No TOC needs identified. CSW signing off.  Laquitha Heslin, CSW [phone]  

## 2021-03-25 NOTE — Progress Notes (Signed)
Progress Note  Patient Name: Cindy [name] Date of Encounter: 03/25/2021  Primary Cardiologist: New to [facility] - consult by Kirke Corin  Subjective   LHC yesterday showed no evidence of obstructive CAD with possible faint collaterals from the distal PL branch to small left-sided branches. BP and heart rate have precluded escalation of GDMT. She is without symptoms of angina or decompensation. No right radial arteriotomy site complications.   Inpatient Medications    Scheduled Meds:  aspirin  81 mg Oral Daily   enoxaparin (LOVENOX) injection  40 mg Subcutaneous Q24H   losartan  12.5 mg Oral Daily   rosuvastatin  20 mg Oral QPM   sodium chloride flush  3 mL Intravenous Q12H   Continuous Infusions:  sodium chloride     PRN Meds: sodium chloride, acetaminophen **OR** acetaminophen, melatonin, nitroGLYCERIN, ondansetron **OR** ondansetron (ZOFRAN) IV, sodium chloride flush   Vital Signs    Vitals:   03/24/21 2011 03/25/21 0058 03/25/21 0532 03/25/21 0815  BP: 112/77 (!) 126/104 (!) 81/46 91/61  Pulse: 64 66 62 66  Resp: 16 16 18 17   Temp: 98 F (36.7 C) 98.4 F (36.9 C) 97.9 F (36.6 C) 98.2 F (36.8 C)  TempSrc: Oral   Oral  SpO2: 98% 98% 99% 97%  Weight:      Height:        Intake/Output Summary (Last 24 hours) at 03/25/2021 0935 Last data filed at 03/25/2021 0816 Gross per 24 hour  Intake 490 ml  Output --  Net 490 ml   Filed Weights   03/23/21 1748  Weight: 54.4 kg    Telemetry    Sinus rhythm with sinus bradycardia with rates in the 50s to 60s bpm - Personally Reviewed  ECG    No new tracings - Personally Reviewed  Physical Exam   GEN: No acute distress.   Neck: No JVD. Cardiac: RRR, no murmurs, rubs, or gallops. Right radial arteriotomy site is dressed without bleeding, swelling, bruising, warmth, erythema, or TTP. Radial pulse 2+ proximal and distal to the arteriotomy site.  Respiratory: Clear to auscultation bilaterally.  GI: Soft, nontender,  non-distended.   MS: No edema; No deformity. Neuro:  Alert and oriented x 3; Nonfocal.  Psych: Normal affect.  Labs    Chemistry Recent Labs  Lab 03/23/21 1752 03/24/21 0512  NA 139 139  K 3.5 3.6  CL 107 109  CO2 21* 23  GLUCOSE 111* 98  BUN 20 23  CREATININE 0.80 0.80  CALCIUM 9.5 9.1  GFRNONAA >60 >60  ANIONGAP 11 7     Hematology Recent Labs  Lab 03/23/21 1752 03/24/21 0512  WBC 9.7 9.9  RBC 4.54 4.09  HGB 13.9 12.9  HCT 42.8 39.4  MCV 94.3 96.3  MCH 30.6 31.5  MCHC 32.5 32.7  RDW 12.3 12.3  PLT 387 346    Cardiac EnzymesNo results for input(s): TROPONINI in the last 168 hours. No results for input(s): TROPIPOC in the last 168 hours.   BNP Recent Labs  Lab 03/23/21 2137  BNP 19.3     DDimer No results for input(s): DDIMER in the last 168 hours.   Radiology    DG Chest 2 View  Result Date: 03/23/2021 IMPRESSION: No active cardiopulmonary disease. Electronically Signed   By: 03/25/2021 M.D.   On: 03/23/2021 18:16    Cardiac Studies   LHC 03/24/2021:   There is mild left ventricular systolic dysfunction.   LV end diastolic pressure is mildly elevated.  The left ventricular ejection fraction is 45-50% by visual estimate.   1.  No normal coronary arteries with no evidence of obstructive disease.  Possible faint collaterals from the distal PL branch to small left-sided branches. 2.  Mildly reduced LV systolic function.  Wall motion could not be evaluated.  There was anteroseptal wall motion abnormality on echocardiogram.  Mildly elevated left ventricular end-diastolic pressure.   Recommendations: Recommend medical therapy with low-dose aspirin indefinitely, Toprol and rosuvastatin. Differential diagnosis includes small vessel disease versus a variant form of stress-induced cardiomyopathy.  Suspect being able to discharge the patient tomorrow if no issues. __________  2D echo 03/24/2021: 1. Left ventricular ejection fraction, by estimation, is  40 to 45%. The  left ventricle has mildly decreased function. The left ventricle  demonstrates regional wall motion abnormalities (see scoring  diagram/findings for description). Left ventricular  diastolic parameters are consistent with Grade I diastolic dysfunction  (impaired relaxation). There is akinesis of the left ventricular, mid  anteroseptal wall.   2. Right ventricular systolic function is normal. The right ventricular  size is normal. There is normal pulmonary artery systolic pressure.   3. The mitral valve is normal in structure. No evidence of mitral valve  regurgitation. No evidence of mitral stenosis.   4. The aortic valve is normal in structure. Aortic valve regurgitation is  not visualized. Aortic valve sclerosis/calcification is present, without  any evidence of aortic stenosis.   5. The inferior vena cava is normal in size with <50% respiratory  variability, suggesting right atrial pressure of 8 mmHg.  Patient Profile     66 y.o. female with no prior known medical history who was admitted on 03/23/2021 with an NSTEMI and found to have no obstructive CAD on LHC.   Assessment & Plan    1. MINOCA: -Admitted with NSTEMI and found to have no obstructive CAD on LHC -ASA 81 mg indefinitely  -No beta blocker as below -Attempt to add ARB as below -Crestor  -Post cath instructions -Cardiac rehab  2. Acute HFrEF: -Concerning for stress-induced cardiomyopathy vs small vessel disease etiology  -Stop beta blocker due to underlying bradycardia (did not receive any) -Attempt to start losartan 12.5 mg daily, if BP will allow, though this had to be held this morning secondary to soft BP -She reports he baseline blood pressure is in the 90s/60s  -Escalate GDMT as able -Outpatient follow up  3. HLD: -LDL 117 -Crestor -Outpatient follow up  Ok to discharge from our perspective today with follow up in our office in 2 weeks. Doubt she will be able to tolerate ARB, we can look  to start this in the office if BP allows.      For questions or updates, please contact [facility] Please consult [email or web address] for contact info under Cardiology/STEMI.    Signed, Eula Listen, PA-C [facility] Pager: [phone] 03/25/2021, 9:35 AM

## 2021-03-25 NOTE — Discharge Summary (Signed)
Physician Discharge Summary   Patient: Cindy [name] MRN: [id number] DOB: 1955/03/31  Admit date:     03/23/2021  Discharge date: 04/23/21  Discharge Physician: Pennie Banter   PCP: Jenell Milliner, MD   Recommendations at discharge:    Follow up with cardiology in 2 weeks Monitor BP and follow up on addition of GDMT Follow up with PCP in 1 week Repeat BMP/CBC/Mg in 1-2 weeks   Discharge Diagnoses: Principal Problem:   NSTEMI (non-ST elevated myocardial infarction) [facility]) Active Problems:   Chest pain   Elevated troponin   Sinus bradycardia by electrocardiogram   Hyperlipidemia    Hospital Course:  66 year old female with no prior medical history who presented to the ED on evening of 03/23/2021 for evaluation of left-sided chest pain radiating to her left arm.  Onset occurred at rest.  Pain did not improve until she received nitroglycerin in the ED.  She has no significant risk factors for cardiovascular disease, but does report family history of CAD.  She denies being overly stressed but she and family are planning to move to [place] soon and buying a new home after her husband got a job there.  She does not feel stressed by this however.  Troponin was trended and significantly elevated above 2600.  She had some ST segment changes on her EKG as well.  She was started on IV heparin and cardiology consulted.  Patient was taken t for cardiac cath on the afternoon of 1/30.  It showed normal coronary arteries with no evidence of obstructive disease.  Mildly reduced LV systolic function with EF 45 to 50%.     Assessment and Plan: * NSTEMI (non-ST elevated myocardial infarction) (HCC)- (present on admission) Patient presented with chest pain, ST changes on EKG and troponin rising to above 2600. -- Cardiology consulted --Treated with IV heparin -- Cardiac cath showed no obstructive coronary disease, mildly reduced EF of 45 to 50%.   -- Echo did show some anteroseptal wall motion  abnormality -- Started on aspirin, Toprol and Crestor -- As needed sublingual nitro Cardiology suspects chest pain due to small vessel disease versus a variant form of a stress-induced cardiomyopathy  Elevated troponin- (present on admission) Troponin trended and increased above 2600. Trend until declining.  Chest pain- (present on admission) Present on admission with left-sided chest pain radiating to left arm.  Management as outlined under NSTEMI  Sinus bradycardia by electrocardiogram- (present on admission) Cardiology following.  Heart rates have been in the 50s and 60s today  Hyperlipidemia- (present on admission) LDL 117 on lipid panel.  Started on rosuvastatin.           Consultants: Cardiology Procedures performed: Cardiac cath, Echo Disposition: Home Diet recommendation:  Discharge Diet Orders (From admission, onward)     Start     Ordered   03/25/21 0000  Diet - low sodium heart healthy        03/25/21 1141           Cardiac diet  DISCHARGE MEDICATION: Allergies as of 03/25/2021   No Known Allergies      Medication List     TAKE these medications    alendronate 70 MG tablet Commonly known as: FOSAMAX Take 70 mg by mouth once a week.   aspirin 81 MG chewable tablet Chew 1 tablet (81 mg total) by mouth daily.   nitroGLYCERIN 0.4 MG SL tablet Commonly known as: NITROSTAT Place 1 tablet (0.4 mg total) under the tongue every 5 (five)  minutes as needed for chest pain.   rosuvastatin 20 MG tablet Commonly known as: CRESTOR Take 1 tablet (20 mg total) by mouth every evening.        Follow-up Information     Iran Ouch, MD. Go in 2 week(s).   Specialty: Cardiology Why: Follow up in 2 weeks Appointment scheduled on Wednesday, 04/09/21 @ 2:20 with Eula Listen, PA Contact information: [address] [place] [address] [phone]         Jenell Milliner, MD. Go in 1 week(s).   Specialty: Internal  Medicine Why: Hospital folllow up in 1 week.  Appointment scheduled on 04/01/2021 @ 3:20pm with Cleon Dew, NP. Please arrive by 3:05pm Contact information: [address] [facility] [place] [address] [phone]                 Discharge Exam: Filed Weights   03/23/21 1748  Weight: 54.4 kg   General exam: awake, alert, no acute distress Respiratory system: CTAB, no wheezes, rales or rhonchi, normal respiratory effort. Cardiovascular system: normal S1/S2, RRR, no pedal edema.   Gastrointestinal system: soft, non-tender, +bowel sounds. Central nervous system: A&O x3. no gross focal neurologic deficits, normal speech Extremities: moves all, no edema, normal tone Skin: dry, intact, normal temperature Psychiatry: normal mood, congruent affect   Condition at discharge: stable  The results of significant diagnostics from this hospitalization (including imaging, microbiology, ancillary and laboratory) are listed below for reference.   Imaging Studies: No results found.  Microbiology: Results for orders placed or performed during the hospital encounter of 03/23/21  Resp Panel by RT-PCR (Flu A&B, Covid) Nasopharyngeal Swab     Status: None   Collection Time: 03/23/21  9:37 PM   Specimen: Nasopharyngeal Swab; Nasopharyngeal(NP) swabs in vial transport medium  Result Value Ref Range Status   SARS Coronavirus 2 by RT PCR NEGATIVE NEGATIVE Final    Comment: (NOTE) SARS-CoV-2 target nucleic acids are NOT DETECTED.  The SARS-CoV-2 RNA is generally detectable in upper respiratory specimens during the acute phase of infection. The lowest concentration of SARS-CoV-2 viral copies this assay can detect is 138 copies/mL. A negative result does not preclude SARS-Cov-2 infection and should not be used as the sole basis for treatment or other patient management decisions. A negative result may occur with  improper specimen collection/handling, submission of  specimen other than nasopharyngeal swab, presence of viral mutation(s) within the areas targeted by this assay, and inadequate number of viral copies(<138 copies/mL). A negative result must be combined with clinical observations, patient history, and epidemiological information. The expected result is Negative.  Fact Sheet for Patients:  BloggerCourse.com  Fact Sheet for Healthcare Providers:  SeriousBroker.it  This test is no t yet approved or cleared by the [place] FDA and  has been authorized for detection and/or diagnosis of SARS-CoV-2 by FDA under an Emergency Use Authorization (EUA). This EUA will remain  in effect (meaning this test can be used) for the duration of the COVID-19 declaration under Section 564(b)(1) of the Act, 21 U.S.C.section 360bbb-3(b)(1), unless the authorization is terminated  or revoked sooner.       Influenza A by PCR NEGATIVE NEGATIVE Final   Influenza B by PCR NEGATIVE NEGATIVE Final    Comment: (NOTE) The Xpert Xpress SARS-CoV-2/FLU/RSV plus assay is intended as an aid in the diagnosis of influenza from Nasopharyngeal swab specimens and should not be used as a sole basis for treatment. Nasal washings and aspirates are  unacceptable for Xpert Xpress SARS-CoV-2/FLU/RSV testing.  Fact Sheet for Patients: BloggerCourse.com  Fact Sheet for Healthcare Providers: SeriousBroker.it  This test is not yet approved or cleared by the [place] FDA and has been authorized for detection and/or diagnosis of SARS-CoV-2 by FDA under an Emergency Use Authorization (EUA). This EUA will remain in effect (meaning this test can be used) for the duration of the COVID-19 declaration under Section 564(b)(1) of the Act, 21 U.S.C. section 360bbb-3(b)(1), unless the authorization is terminated or revoked.  Performed at [facility], [address] [place], [place] [address]     Labs: CBC: No results for input(s): WBC, NEUTROABS, HGB, HCT, MCV, PLT in the last 168 hours.  Basic Metabolic Panel: Recent Labs  Lab 04/22/21 0826  NA 141  K 4.7  CL 106  CO2 23  GLUCOSE 91  BUN 18  CREATININE 0.87  CALCIUM 9.1   Liver Function Tests: No results for input(s): AST, ALT, ALKPHOS, BILITOT, PROT, ALBUMIN in the last 168 hours. CBG: No results for input(s): GLUCAP in the last 168 hours.  Discharge time spent: less than 30 minutes.  Signed: Pennie Banter, DO [facility] 04/23/2021

## 2021-03-26 ENCOUNTER — Encounter: Payer: Self-pay | Admitting: Cardiovascular Disease

## 2021-04-09 ENCOUNTER — Ambulatory Visit: Payer: BC Managed Care – PPO | Admitting: Physician Assistant

## 2021-04-15 ENCOUNTER — Other Ambulatory Visit: Payer: Self-pay

## 2021-04-15 ENCOUNTER — Encounter: Payer: Self-pay | Admitting: Medical

## 2021-04-15 ENCOUNTER — Ambulatory Visit: Payer: BC Managed Care – PPO | Admitting: Medical

## 2021-04-15 VITALS — BP 120/70 | HR 66 | Ht 62.0 in | Wt 143.0 lb

## 2021-04-15 DIAGNOSIS — I5022 Chronic systolic (congestive) heart failure: Secondary | ICD-10-CM | POA: Diagnosis not present

## 2021-04-15 DIAGNOSIS — I428 Other cardiomyopathies: Secondary | ICD-10-CM | POA: Diagnosis not present

## 2021-04-15 DIAGNOSIS — E782 Mixed hyperlipidemia: Secondary | ICD-10-CM

## 2021-04-15 MED ORDER — DAPAGLIFLOZIN PROPANEDIOL 10 MG PO TABS: 10.0000 mg | ORAL_TABLET | Freq: Every day | ORAL | 3 refills | Status: DC

## 2021-04-15 MED ORDER — ISOSORBIDE MONONITRATE ER 30 MG PO TB24
15.0000 mg | ORAL_TABLET | Freq: Every day | ORAL | 3 refills | Status: DC
Start: 2021-04-15 — End: 2021-05-15

## 2021-04-15 NOTE — Progress Notes (Signed)
Cardiology Office Note:    Date:  04/15/2021   ID:  Cindy [name], DOB 24-Mar-1955, MRN [id number]  PCP:  No primary care provider on file.  [facility] Cardiologist:  Lorine Bears, MD  [facility] Electrophysiologist:  None   Referring MD: Jenell Milliner, MD   Chief Complaint: Hospital follow-up  History of Present Illness:    Cindy [name] is a 66 y.o. female with recently diagnosed NICM, HFrEF, and dyslipidemia who presents for hospital follow-up.   Admitted 1/29- 1/31 for NSTEMI. HS trop was 47076. Patient underwent cardiac cath which showed no obstructive disease. Suspected pain was from small vessel disease vs stress induced CM. Echo showed LVEF 45-50%. EKG showed sinus bradycardia, precluding BB use. She was discharged on Aspirin and Crestor.   Today, the patient reports she has been doing well since being home. Feels she may have some stress. More recently  her husband moved to another state for work, she will move with him in the future. Reports she is still having episodes of chest pressure. It is in the center of the chest. Not worse with exertion. Went on a cruise last week and walked a lot, and had anginal symptoms. NTG did seem to improve the pain. Lasts for 5 minutes. Similar to pain in the hospital, but not as severe. This is 1/10 pain. No LLE, orthopnea, pnd, palpitations. She walks as much as she can . No drug or alcohol history.   Past Medical History:  Diagnosis Date   Dyslipidemia    HFrEF (heart failure with reduced ejection fraction) (HCC)    Nonischemic cardiomyopathy (HCC)     Past Surgical History:  Procedure Laterality Date   APPENDECTOMY     CESAREAN SECTION     LEFT HEART CATH AND CORONARY ANGIOGRAPHY N/A 03/24/2021   Procedure: LEFT HEART CATH AND CORONARY ANGIOGRAPHY;  Surgeon: Iran Ouch, MD;  Location: [facility] INVASIVE CV LAB;  Service: Cardiovascular;  Laterality: N/A;   TONSILLECTOMY      Current Medications: Current Meds  Medication  Sig   alendronate (FOSAMAX) 70 MG tablet Take 70 mg by mouth once a week.   aspirin 81 MG chewable tablet Chew 1 tablet (81 mg total) by mouth daily.   dapagliflozin propanediol (FARXIGA) 10 MG TABS tablet Take 1 tablet (10 mg total) by mouth daily before breakfast.   isosorbide mononitrate (IMDUR) 30 MG 24 hr tablet Take 0.5 tablets (15 mg total) by mouth daily.   nitroGLYCERIN (NITROSTAT) 0.4 MG SL tablet Place 1 tablet (0.4 mg total) under the tongue every 5 (five) minutes as needed for chest pain.   rosuvastatin (CRESTOR) 20 MG tablet Take 1 tablet (20 mg total) by mouth every evening.     Allergies:   Patient has no known allergies.   Social History   Socioeconomic History   Marital status: Married    Spouse name: Not on file   Number of children: Not on file   Years of education: Not on file   Highest education level: Not on file  Occupational History   Not on file  Tobacco Use   Smoking status: Never   Smokeless tobacco: Never  Vaping Use   Vaping Use: Never used  Substance and Sexual Activity   Alcohol use: Yes    Comment: socially   Drug use: Never   Sexual activity: Yes    Partners: Male  Other Topics Concern   Not on file  Social History Narrative   Not on  file   Social Determinants of Health   Financial Resource Strain: Not on file  Food Insecurity: Not on file  Transportation Needs: Not on file  Physical Activity: Not on file  Stress: Not on file  Social Connections: Not on file     Family History: The patient's family history includes Heart disease in her father; Hypertension in her father.  ROS:   Please see the history of present illness.     All other systems reviewed and are negative.  EKGs/Labs/Other Studies Reviewed:    The following studies were reviewed today:  Cardiac catheterization 03/24/21   There is mild left ventricular systolic dysfunction.   LV end diastolic pressure is mildly elevated.   The left ventricular ejection fraction  is 45-50% by visual estimate.   1.  Near normal coronary arteries with no evidence of obstructive disease.  Possible faint collaterals from the distal PL branch to small left-sided branches. 2.  Mildly reduced LV systolic function.  Wall motion could not be evaluated.  There was anteroseptal wall motion abnormality on echocardiogram.  Mildly elevated left ventricular end-diastolic pressure.   Recommendations: Recommend medical therapy with low-dose aspirin indefinitely, Toprol and rosuvastatin. Differential diagnosis includes small vessel disease versus a variant form of stress-induced cardiomyopathy.  Suspect being able to discharge the patient tomorrow if no issues.  Echo 02/2021 1. Left ventricular ejection fraction, by estimation, is 40 to 45%. The  left ventricle has mildly decreased function. The left ventricle  demonstrates regional wall motion abnormalities (see scoring  diagram/findings for description). Left ventricular  diastolic parameters are consistent with Grade I diastolic dysfunction  (impaired relaxation). There is akinesis of the left ventricular, mid  anteroseptal wall.   2. Right ventricular systolic function is normal. The right ventricular  size is normal. There is normal pulmonary artery systolic pressure.   3. The mitral valve is normal in structure. No evidence of mitral valve  regurgitation. No evidence of mitral stenosis.   4. The aortic valve is normal in structure. Aortic valve regurgitation is  not visualized. Aortic valve sclerosis/calcification is present, without  any evidence of aortic stenosis.   5. The inferior vena cava is normal in size with <50% respiratory  variability, suggesting right atrial pressure of 8 mmHg.   EKG:  EKG is  ordered today.  The ekg ordered today demonstrates SR 66bpm, low voltage, TWI III, no changes  Recent Labs: 03/23/2021: B Natriuretic Peptide 19.3; Magnesium 2.3; TSH 1.126 03/24/2021: BUN 23; Creatinine, Ser 0.80;  Hemoglobin 12.9; Platelets 346; Potassium 3.6; Sodium 139  Recent Lipid Panel    Component Value Date/Time   CHOL 181 03/24/2021 0512   TRIG 87 03/24/2021 0512   HDL 47 03/24/2021 0512   CHOLHDL 3.9 03/24/2021 0512   VLDL 17 03/24/2021 0512   LDLCALC 117 (H) 03/24/2021 0512      Physical Exam:    VS:  BP 120/70 (BP Location: Left Arm, Patient Position: Sitting, Cuff Size: Normal)    Pulse 66    Ht 5\' 2"  (1.575 m)    Wt 143 lb (64.9 kg)    SpO2 98%    BMI 26.16 kg/m     Wt Readings from Last 3 Encounters:  04/15/21 143 lb (64.9 kg)  03/23/21 120 lb (54.4 kg)  06/20/20 149 lb 14.6 oz (68 kg)     GEN:  Well nourished, well developed in no acute distress HEENT: Normal NECK: No JVD; No carotid bruits LYMPHATICS: No lymphadenopathy CARDIAC: RRR, no  murmurs, rubs, gallops RESPIRATORY:  Clear to auscultation without rales, wheezing or rhonchi  ABDOMEN: Soft, non-tender, non-distended MUSCULOSKELETAL:  No edema; No deformity  SKIN: Warm and dry NEUROLOGIC:  Alert and oriented x 3 PSYCHIATRIC:  Normal affect   ASSESSMENT:    1. Chronic systolic heart failure (HCC)   2. Nonischemic cardiomyopathy (HCC)   3. Hyperlipidemia, mixed    PLAN:    In order of problems listed above:  NSTEMI Chest pain Small vessel dz vs stress induced CM Patient has been doing well since discharge. She reports persistent chest pressure, improved with NTG, although reassuring it is not worse with exertion. Suspect there maybe a level of anxiety. Cath showed no significant CAD, Echo with reduced EF 40-45%, possible small vessel disease vs stress induced cardiomyopathy. Cath site, right radial, healed well. She is taking Aspirin and Crestor. No BB with SB. I will add Imdur 15mg  daily, check BP daily since she reports soft pressures in the past. Concerned she may only tolerate small dose antianginal therapy, such as amlodipine. I will refer her to cardiac rehab.   Cardiomyopathy HFrEF GDMT Echo during  recent admission showed LVEF 40-45%, suspected stress-induced CM. Husband recently moved for a job, which may be contributing. She is euvolemic on exam. I will add Farxiga 10mg  daily, BMET in a week. Continue GDMTas able. H/o of soft pressures may limit this. Plan to repeat echo in 2-3 months.   HLD LDL 117. Continue Crestor. PCP has ordered follow-up labs.    Disposition: Follow up in 1 month(s) with MD/APP      Signed, Haden Suder , PA-C  04/15/2021 9:10 AM    [facility]

## 2021-04-15 NOTE — Patient Instructions (Signed)
Medication Instructions:  Your physician has recommended you make the following change in your medication:   START taking isosorbide mononitrate (Imdur) 15 mg daily   START taking dapagliflozin propanediol (Farxiga) 10 mg daily   *If you need a refill on your cardiac medications before your next appointment, please call your pharmacy*   Lab Work: Your physician recommends that you return for lab work (BMET) in: 1 week    Please return to our office on_____________________at______________am/pm   If you have labs (blood work) drawn today and your tests are completely normal, you will receive your results only by: [place] (if you have MyChart) OR A paper copy in the mail If you have any lab test that is abnormal or we need to change your treatment, we will call you to review the results.   Testing/Procedures: None ordered   Follow-Up: At [facility], you and your health needs are our priority.  As part of our continuing mission to provide you with exceptional heart care, we have created designated Provider Care Teams.  These Care Teams include your primary Cardiologist (physician) and Advanced Practice Providers (APPs -  Physician Assistants and Nurse Practitioners) who all work together to provide you with the care you need, when you need it.  We recommend signing up for the patient portal called "MyChart".  Sign up information is provided on this After Visit Summary.  MyChart is used to connect with patients for Virtual Visits (Telemedicine).  Patients are able to view lab/test results, encounter notes, upcoming appointments, etc.  Non-urgent messages can be sent to your provider as well.   To learn more about what you can do with MyChart, go to [email or web address].    Your next appointment:   1 month(s)  The format for your next appointment:   In Person  Provider:   You may see Kathlyn Sacramento, MD or one of the following Advanced Practice Providers on your  designated Care Team:   Murray Hodgkins, NP Christell Faith, PA-C Cadence Kathlen Mody, [place]   Other Instructions We have sent a referral to cardiac rehab, they will call you to schedule

## 2021-04-22 ENCOUNTER — Other Ambulatory Visit (INDEPENDENT_AMBULATORY_CARE_PROVIDER_SITE_OTHER): Payer: BC Managed Care – PPO

## 2021-04-22 ENCOUNTER — Other Ambulatory Visit: Payer: Self-pay

## 2021-04-22 DIAGNOSIS — I5022 Chronic systolic (congestive) heart failure: Secondary | ICD-10-CM | POA: Diagnosis not present

## 2021-04-23 LAB — BASIC METABOLIC PANEL
BUN/Creatinine Ratio: 21 (ref 12–28)
BUN: 18 mg/dL (ref 8–27)
CO2: 23 mmol/L (ref 20–29)
Calcium: 9.1 mg/dL (ref 8.7–10.3)
Chloride: 106 mmol/L (ref 96–106)
Creatinine, Ser: 0.87 mg/dL (ref 0.57–1.00)
Glucose: 91 mg/dL (ref 70–99)
Potassium: 4.7 mmol/L (ref 3.5–5.2)
Sodium: 141 mmol/L (ref 134–144)
eGFR: 75 mL/min/{1.73_m2} (ref >59)

## 2021-05-15 MED ORDER — ISOSORBIDE MONONITRATE ER 30 MG PO TB24: 30.0000 mg | ORAL_TABLET | Freq: Every day | ORAL | 3 refills | Status: DC

## 2021-05-19 ENCOUNTER — Ambulatory Visit (INDEPENDENT_AMBULATORY_CARE_PROVIDER_SITE_OTHER): Payer: BC Managed Care – PPO | Admitting: Medical

## 2021-05-19 ENCOUNTER — Encounter: Payer: Self-pay | Admitting: Medical

## 2021-05-19 ENCOUNTER — Other Ambulatory Visit: Payer: Self-pay

## 2021-05-19 VITALS — BP 98/68 | HR 76 | Ht 62.0 in | Wt 142.0 lb

## 2021-05-19 DIAGNOSIS — I5022 Chronic systolic (congestive) heart failure: Secondary | ICD-10-CM | POA: Diagnosis not present

## 2021-05-19 DIAGNOSIS — E782 Mixed hyperlipidemia: Secondary | ICD-10-CM

## 2021-05-19 DIAGNOSIS — R079 Chest pain, unspecified: Secondary | ICD-10-CM

## 2021-05-19 DIAGNOSIS — I428 Other cardiomyopathies: Secondary | ICD-10-CM

## 2021-05-19 NOTE — Progress Notes (Signed)
?Cardiology Office Note:   ? ?Date:  05/19/2021  ? ?Cindy [name], DOB Jan 26, 1956, MRN [id number] ? ?PCP:  Danae Orleans, MD  ?[place] Cardiologist:  Kathlyn Sacramento, MD  ?[facility] Electrophysiologist:  None  ? ?Referring MD: Danae Orleans, MD  ? ?Chief Complaint: 1 month follow-up ? ?History of Present Illness:   ? ?Cindy [name] is a 66 y.o. female with a hx of NICM, HFrEF, and dyslipidemia who presents for hospital follow-up.  ?  ?Admitted 1/29- 1/31 for NSTEMI. HS trop was 26000. Patient underwent cardiac cath which showed no obstructive disease. Suspected pain was from small vessel disease vs stress induced CM. Echo showed LVEF 45-50%. EKG showed sinus bradycardia, precluding BB use. She was discharged on Aspirin and Crestor.  ?  ?Last seen 04/06/21 and was doing well. She reports chest pressure improved with NTG. Imdur was added. [place] was added for heart failure.  ? ?Today, the patient reports chest pain is better but still has occasional chest pain. Has not needed NTG SL. Sometimes it's burning sensation sometimes a pressure sensation. She is doing regular exercise and has not exertional chest pain with this. BP is borderline soft, the patient denies dizziness or lightheadedness. No LLE, shortness of breath, orthopnea, pnd, palpitations. She says she is sure chest discomfort is not from acid reflux.  ? ?Past Medical History:  ?Diagnosis Date  ? Dyslipidemia   ? HFrEF (heart failure with reduced ejection fraction) ([place])   ? Nonischemic cardiomyopathy ([place])   ? ? ?Past Surgical History:  ?Procedure Laterality Date  ? APPENDECTOMY    ? CESAREAN SECTION    ? LEFT HEART CATH AND CORONARY ANGIOGRAPHY N/A 03/24/2021  ? Procedure: LEFT HEART CATH AND CORONARY ANGIOGRAPHY;  Surgeon: Wellington Hampshire, MD;  Location: [place] [facility];  Service: Cardiovascular;  Laterality: N/A;  ? TONSILLECTOMY    ? ? ?Current Medications: ?Current Meds  ?Medication Sig  ? alendronate (FOSAMAX) 70 MG tablet Take  70 mg by mouth once a week.  ? aspirin 81 MG chewable tablet Chew 1 tablet (81 mg total) by mouth daily.  ? dapagliflozin propanediol (FARXIGA) 10 MG TABS tablet Take 1 tablet (10 mg total) by mouth daily before breakfast.  ? isosorbide mononitrate (IMDUR) 30 MG 24 hr tablet Take 1 tablet (30 mg total) by mouth daily.  ? nitroGLYCERIN (NITROSTAT) 0.4 MG SL tablet Place 1 tablet (0.4 mg total) under the tongue every 5 (five) minutes as needed for chest pain.  ? rosuvastatin (CRESTOR) 20 MG tablet Take 1 tablet (20 mg total) by mouth every evening.  ?  ? ?Allergies:   Patient has no known allergies.  ? ?Social History  ? ?Socioeconomic History  ? Marital status: Married  ?  Spouse name: Not on file  ? Number of children: Not on file  ? Years of education: Not on file  ? Highest education level: Not on file  ?Occupational History  ? Not on file  ?Tobacco Use  ? Smoking status: Never  ? Smokeless tobacco: Never  ?Vaping Use  ? Vaping Use: Never used  ?Substance and Sexual Activity  ? Alcohol use: Yes  ?  Comment: socially  ? Drug use: Never  ? Sexual activity: Yes  ?  Partners: Male  ?Other Topics Concern  ? Not on file  ?Social History Narrative  ? Not on file  ? ?Social Determinants of Health  ? ?Financial Resource Strain: Not on file  ?Food Insecurity: Not  on file  ?Transportation Needs: Not on file  ?Physical Activity: Not on file  ?Stress: Not on file  ?Social Connections: Not on file  ?  ? ?Family History: ?The patient's family history includes Heart disease in her father; Hypertension in her father. ? ?ROS:   ?Please see the history of present illness.    ? All other systems reviewed and are negative. ? ?EKGs/Labs/Other Studies Reviewed:   ? ?The following studies were reviewed today: ? ?[facility] 03/24/21 ?  ?  There is mild left ventricular systolic dysfunction. ?  LV end diastolic pressure is mildly elevated. ?  The left ventricular ejection fraction is 45-50% by visual estimate. ?  ?1.  Near normal coronary arteries  with no evidence of obstructive disease.  Possible faint collaterals from the distal PL branch to small left-sided branches. ?2.  Mildly reduced LV systolic function.  Wall motion could not be evaluated.  There was anteroseptal wall motion abnormality on echocardiogram.  Mildly elevated left ventricular end-diastolic pressure. ?  ?Recommendations: ?Recommend medical therapy with low-dose aspirin indefinitely, Toprol and rosuvastatin. ?Differential diagnosis includes small vessel disease versus a variant form of stress-induced cardiomyopathy.  Suspect being able to discharge the patient tomorrow if no issues. ?  ? ?EKG:  EKG is not ordered today.  ? ?Recent Labs: ?03/23/2021: B Natriuretic Peptide 19.3; Magnesium 2.3; TSH 1.126 ?03/24/2021: Hemoglobin 12.9; Platelets 346 ?04/22/2021: BUN 18; Creatinine, Ser 0.87; Potassium 4.7; Sodium 141  ?Recent Lipid Panel ?   ?Component Value Date/Time  ? CHOL 181 03/24/2021 0512  ? TRIG 87 03/24/2021 0512  ? HDL 47 03/24/2021 0512  ? CHOLHDL 3.9 03/24/2021 0512  ? VLDL 17 03/24/2021 0512  ? [place] 117 (H) 03/24/2021 [id number]  ? ? ?Physical Exam:   ? ?VS:  BP 98/68 (BP Location: Left Arm, Patient Position: Sitting, Cuff Size: Normal)   Pulse 76   Ht 5\' 2"  (1.575 m)   Wt 142 lb (64.4 kg)   SpO2 98%   BMI 25.97 kg/m?    ? ?Wt Readings from Last 3 Encounters:  ?05/19/21 142 lb (64.4 kg)  ?04/15/21 143 lb (64.9 kg)  ?03/23/21 120 lb (54.4 kg)  ?  ? ?GEN:  Well nourished, well developed in no acute distress ?HEENT: Normal ?NECK: No JVD; No carotid bruits ?LYMPHATICS: No lymphadenopathy ?CARDIAC: RRR, no murmurs, rubs, gallops ?RESPIRATORY:  Clear to auscultation without rales, wheezing or rhonchi  ?ABDOMEN: Soft, non-tender, non-distended ?MUSCULOSKELETAL:  No edema; No deformity  ?SKIN: Warm and dry ?NEUROLOGIC:  Alert and oriented x 3 ?PSYCHIATRIC:  Normal affect  ? ?ASSESSMENT:   ? ?1. Chest pain of uncertain etiology   ?2. Nonischemic cardiomyopathy ([place])   ?3. Chronic systolic  heart failure ([place])   ?4. Hyperlipidemia, mixed   ? ?PLAN:   ? ?In order of problems listed above: ? ?Chest pain ?Small vessel dz ?Patient reports improvement of chest pain with Imdur 30mg  daily. Denies exertional angina. Prior cath showed no obstructive CAD. She is sure pain is not acid-reflux. Overall reassuring given cardiac cath. No further ischemic work-up at this time. Low BP limiting addition of antianginal medications. Continue Aspirin, Crestor and Imdur.  ? ?Cardiomyopathy ?HFrEF EF 45-50% ?Suspected small vessel disease vs stress induced CM. She is on [place]. BP will not tolerate GDMT. We will repeat a limited echo in 1 month.  ? ?HLD ?LDL 117, Continue Crestor. Can re-check lipid panel at follow-up.  ? ?Disposition: Follow up in 3 month(s) with MD/APP  ? ? ? ? ?  Signed, ?Tayra Dawe Ninfa Meeker, PA-C  ?05/19/2021 8:35 AM    ?[facility] ? ?

## 2021-05-19 NOTE — Patient Instructions (Signed)
Medication Instructions:  ? ?Your physician recommends that you continue on your current medications as directed. Please refer to the Current Medication list given to you today.  ? ?*If you need a refill on your cardiac medications before your next appointment, please call your pharmacy* ? ? ?Lab Work: ?None ordered ? ?If you have labs (blood work) drawn today and your tests are completely normal, you will receive your results only by: ?MyChart Message (if you have MyChart) OR ?A paper copy in the mail ?If you have any lab test that is abnormal or we need to change your treatment, we will call you to review the results. ? ? ?Testing/Procedures: ? ?Your physician has requested that you have an echocardiogram in 1 month. Echocardiography is a painless test that uses sound waves to create images of your heart. It provides your doctor with information about the size and shape of your heart and how well your heart?s chambers and valves are working. This procedure takes approximately one hour. There are no restrictions for this procedure.  ? ? ?Follow-Up: ?At [facility], you and your health needs are our priority.  As part of our continuing mission to provide you with exceptional heart care, we have created designated Provider Care Teams.  These Care Teams include your primary Cardiologist (physician) and Advanced Practice Providers (APPs -  Physician Assistants and Nurse Practitioners) who all work together to provide you with the care you need, when you need it. ? ?We recommend signing up for the patient portal called "MyChart".  Sign up information is provided on this After Visit Summary.  MyChart is used to connect with patients for Virtual Visits (Telemedicine).  Patients are able to view lab/test results, encounter notes, upcoming appointments, etc.  Non-urgent messages can be sent to your provider as well.   ?To learn more about what you can do with MyChart, go to [email or web address].   ? ?Your next  appointment:   ?3 month(s) ? ?The format for your next appointment:   ?In Person ? ?Provider:   ?You may see one of the following Advanced Practice Providers on your designated Care Team:   ?Nicolasa Ducking, NP ?Eula Listen, PA-C ?Cadence Fransico Michael, PA-C ? ? ?Other Instructions ?N/A ?

## 2021-06-19 ENCOUNTER — Ambulatory Visit (INDEPENDENT_AMBULATORY_CARE_PROVIDER_SITE_OTHER): Payer: BC Managed Care – PPO

## 2021-06-19 DIAGNOSIS — I428 Other cardiomyopathies: Secondary | ICD-10-CM | POA: Diagnosis not present

## 2021-06-19 LAB — ECHOCARDIOGRAM LIMITED
Area-P 1/2: 3.02 cm2
Calc EF: 56.6 %
S' Lateral: 3 cm
Single Plane A2C EF: 56.6 %
Single Plane A4C EF: 55.5 %

## 2021-06-23 MED ORDER — ROSUVASTATIN CALCIUM 20 MG PO TABS: 20.0000 mg | ORAL_TABLET | Freq: Every evening | ORAL | 5 refills | Status: DC

## 2021-06-23 MED ORDER — ASPIRIN EC 81 MG PO TBEC: 81.0000 mg | DELAYED_RELEASE_TABLET | Freq: Every day | ORAL | 5 refills | Status: AC

## 2021-08-09 ENCOUNTER — Other Ambulatory Visit: Payer: Self-pay | Admitting: Medical

## 2021-08-20 ENCOUNTER — Ambulatory Visit: Payer: BC Managed Care – PPO | Admitting: Medical

## 2021-09-30 ENCOUNTER — Encounter: Payer: Self-pay | Admitting: Medical

## 2021-09-30 ENCOUNTER — Ambulatory Visit (INDEPENDENT_AMBULATORY_CARE_PROVIDER_SITE_OTHER): Payer: BC Managed Care – PPO | Admitting: Cardiology

## 2021-09-30 VITALS — BP 86/60 | HR 75 | Ht 62.0 in | Wt 142.5 lb

## 2021-09-30 DIAGNOSIS — I214 Non-ST elevation (NSTEMI) myocardial infarction: Secondary | ICD-10-CM

## 2021-09-30 DIAGNOSIS — I5022 Chronic systolic (congestive) heart failure: Secondary | ICD-10-CM | POA: Diagnosis not present

## 2021-09-30 DIAGNOSIS — E782 Mixed hyperlipidemia: Secondary | ICD-10-CM | POA: Diagnosis not present

## 2021-09-30 DIAGNOSIS — I428 Other cardiomyopathies: Secondary | ICD-10-CM | POA: Diagnosis not present

## 2021-09-30 MED ORDER — ISOSORBIDE MONONITRATE ER 30 MG PO TB24: 30.0000 mg | ORAL_TABLET | Freq: Every day | ORAL | 3 refills | Status: DC

## 2021-09-30 MED ORDER — NITROGLYCERIN 0.4 MG SL SUBL: 0.4000 mg | SUBLINGUAL_TABLET | SUBLINGUAL | 3 refills | Status: AC | PRN

## 2021-09-30 MED ORDER — DAPAGLIFLOZIN PROPANEDIOL 10 MG PO TABS: 10.0000 mg | ORAL_TABLET | Freq: Every day | ORAL | 0 refills | Status: DC

## 2021-09-30 MED ORDER — ROSUVASTATIN CALCIUM 20 MG PO TABS: 20.0000 mg | ORAL_TABLET | Freq: Every evening | ORAL | 0 refills | Status: DC

## 2021-09-30 NOTE — Patient Instructions (Signed)
Medication Instructions:  Your physician recommends that you continue on your current medications as directed. Please refer to the Current Medication list given to you today.  *If you need a refill on your cardiac medications before your next appointment, please call your pharmacy*   Lab Work: None ordered If you have labs (blood work) drawn today and your tests are completely normal, you will receive your results only by: MyChart Message (if you have MyChart) OR A paper copy in the mail If you have any lab test that is abnormal or we need to change your treatment, we will call you to review the results.   Testing/Procedures: None ordered   Follow-Up: At [facility], you and your health needs are our priority.  As part of our continuing mission to provide you with exceptional heart care, we have created designated Provider Care Teams.  These Care Teams include your primary Cardiologist (physician) and Advanced Practice Providers (APPs -  Physician Assistants and Nurse Practitioners) who all work together to provide you with the care you need, when you need it.  We recommend signing up for the patient portal called "MyChart".  Sign up information is provided on this After Visit Summary.  MyChart is used to connect with patients for Virtual Visits (Telemedicine).  Patients are able to view lab/test results, encounter notes, upcoming appointments, etc.  Non-urgent messages can be sent to your provider as well.   To learn more about what you can do with MyChart, go to [email or web address].    Your next appointment:   4 month(s)  The format for your next appointment:   In Person  Provider:   You may see Muhammad Arida, MD or one of the following Advanced Practice Providers on your designated Care Team:   Christopher Berge, NP Ryan Dunn, PA-C Cadence Furth, PA-C}    Other Instructions N/A  Important Information About [place]       

## 2021-09-30 NOTE — Progress Notes (Signed)
Cardiology Clinic Note   Patient Name: Cindy [name] Date of Encounter: 09/30/2021  Primary Care Provider:  Jenell Milliner, MD Primary Cardiologist:  Cindy Bears, MD  Patient Profile    66 year old female with a history of NICM, HFrEF, and dyslipidemia who presents today for follow-up on her HFrEF.  Past Medical History    Past Medical History:  Diagnosis Date   Dyslipidemia    HFrEF (heart failure with reduced ejection fraction) (H[facility])    Nonischemic cardiomyopathy ([facility])    Past Surgical History:  Procedure Laterality Date   APPENDECTOMY     CESAREAN SECTION     LEFT HEART CATH AND CORONARY ANGIOGRAPHY N/A 03/24/2021   Procedure: LEFT HEART CATH AND CORONARY ANGIOGRAPHY;  Surgeon: [place] Ouch, MD;  Location: [facility] INVASIVE CV [facility];  Service: Cardiovascular;  Laterality: N/A;   TONSILLECTOMY      Allergies  No Known Allergies  History of Present Illness    Cindy [name] is a 66 year old female with a history of nonischemic cardiomyopathy, heart failure reduced ejection fraction, and dyslipidemia.  She was admitted 1/29 - 1/31 for NSTEMI.  High-sensitivity troponin peaked around 26,000.  Patient underwent cardiac catheterization which showed no obstructive disease.  Suspect the pain was from small vessel disease versus stress-induced cardiomyopathy.  Echocardiogram showed LVEF of 45 to 50%.  EKG showed sinus bradycardia precluding beta-blocker use.  She was discharged on aspirin and Crestor.  When seen in the office on 04/06/2021 she was doing well.  She reported chest pressure that improved with nitroglycerin.  Imdur was added for angina she also had addition of Farxiga for heart failure to escalate GDMT.  Last time she was seen in clinic was 05/19/2021 where she continued to have some occasional chest discomfort but has not needed to take any sublingual nitro since being started on Imdur.  She returns to clinic today feeling well. Denies any chest pain or chest pressure  and has not had to take any of her SL Nitro. She has been under a large amount of stress since June with the untimely passing of her mother. Denies any shortness of breath, dizziness, or peripheral edema. Denies any recent hospitalizations or visits tot he emergency department. Continues with her regular exercise.  Home Medications    Current Outpatient Medications  Medication Sig Dispense Refill   alendronate (FOSAMAX) 70 MG tablet Take 70 mg by mouth once a week.     aspirin EC 81 MG tablet Take 1 tablet (81 mg total) by mouth daily. 30 tablet 5   dapagliflozin propanediol (FARXIGA) 10 MG TABS tablet Take 1 tablet (10 mg total) by mouth daily before breakfast. 90 tablet 0   isosorbide mononitrate (IMDUR) 30 MG 24 hr tablet Take 1 tablet (30 mg total) by mouth daily. 90 tablet 3   nitroGLYCERIN (NITROSTAT) 0.4 MG SL tablet Place 1 tablet (0.4 mg total) under the tongue every 5 (five) minutes as needed for chest pain. 25 tablet 3   rosuvastatin (CRESTOR) 20 MG tablet Take 1 tablet (20 mg total) by mouth every evening. 90 tablet 0   No current facility-administered medications for this visit.     Family History    Family History  Problem Relation Age of Onset   Hypertension Father    Heart disease Father    She indicated that her mother is alive. She indicated that her father is alive.  Social History    Social History   Socioeconomic History   Marital  status: Married    Spouse name: Not on file   Number of children: Not on file   Years of education: Not on file   Highest education level: Not on file  Occupational History   Not on file  Tobacco Use   Smoking status: Never   Smokeless tobacco: Never  Vaping Use   Vaping Use: Never used  Substance and Sexual Activity   Alcohol use: Yes    Comment: socially   Drug use: Never   Sexual activity: Yes    Partners: Male  Other Topics Concern   Not on file  Social History Narrative   Not on file   Social Determinants of  Health   Financial Resource Strain: Not on file  Food Insecurity: Not on file  Transportation Needs: Not on file  Physical Activity: Not on file  Stress: Not on file  Social Connections: Not on file  Intimate Partner Violence: Not on file     Review of Systems    General:  No chills, fever, night sweats or weight changes.  Cardiovascular:  No chest pain, dyspnea on exertion, edema, orthopnea, palpitations, paroxysmal nocturnal dyspnea. Dermatological: No rash, lesions/masses Respiratory: No cough, dyspnea Urologic: No hematuria, dysuria Abdominal:   No nausea, vomiting, diarrhea, bright red blood per rectum, melena, or hematemesis Neurologic:  No visual changes, wkns, changes in mental status. All other systems reviewed and are otherwise negative except as noted above.     Physical Exam    VS:  BP (!) 86/60 (BP Location: Left Arm, Patient Position: Sitting, Cuff Size: Normal)   Pulse 75   Ht 5\' 2"  (1.575 m)   Wt 142 lb 8 oz (64.6 kg)   SpO2 98%   BMI 26.06 kg/m  , BMI Body mass index is 26.06 kg/m.     GEN: Well nourished, well developed, in no acute distress. HEENT: normal. Neck: Supple, no JVD, carotid bruits, or masses. Cardiac: RRR, no murmurs, rubs, or gallops. No clubbing, cyanosis, edema.  Radials/DP/PT 2+ and equal bilaterally.  Respiratory:  Respirations regular and unlabored, clear to auscultation bilaterally. GI: Soft, nontender, nondistended, BS + x 4. MS: no deformity or atrophy. Skin: warm and dry, no rash. Neuro:  Strength and sensation are intact. Psych: Normal affect.  Accessory Clinical Findings    ECG personally reviewed by me today- sinus rate of 75 - No acute changes  Lab Results  Component Value Date   WBC 9.9 03/24/2021   HGB 12.9 03/24/2021   HCT 39.4 03/24/2021   MCV 96.3 03/24/2021   PLT 346 03/24/2021   Lab Results  Component Value Date   CREATININE 0.87 04/22/2021   BUN 18 04/22/2021   NA 141 04/22/2021   K 4.7 04/22/2021    CL 106 04/22/2021   CO2 23 04/22/2021   No results found for: "ALT", "AST", "GGT", "ALKPHOS", "BILITOT" Lab Results  Component Value Date   CHOL 181 03/24/2021   HDL 47 03/24/2021   LDLCALC 117 (H) 03/24/2021   TRIG 87 03/24/2021   CHOLHDL 3.9 03/24/2021    No results found for: "HGBA1C"  Assessment & Plan   1.  Cardiomyopathy, HFrEF improved to 55%.  Currently without any symptoms. Euvolemic on exam. Continue farxiga. Blood pressure will not tolerate escalation of GDMT. Limited echocardiogram completed with  LVEF 55% (which is improved from previous study), no WMA, G1DD, mild MR, and aortic valve sclerosis.   2. Chest pain with small vessel disease s/p NSTEMI 02/2021 currently denies any  anginal symptoms. Continue ASA, imdur, crestor, and nitrostat PRN  3. Hyperlipidemia with LDL 46 on 07/17/21. Continue crestor 20 mg daily. Followed by PCP.  4. Disposition patient to return to see MD/APP in 4 months or sooner if needed. 90 day refills on all medications.  Diandra Cimini, NP 09/30/2021, 4:13 PM

## 2021-12-02 ENCOUNTER — Other Ambulatory Visit: Payer: Self-pay | Admitting: Cardiology

## 2021-12-02 MED ORDER — DAPAGLIFLOZIN PROPANEDIOL 10 MG PO TABS: 10.0000 mg | ORAL_TABLET | Freq: Every day | ORAL | 1 refills | Status: DC

## 2022-01-14 ENCOUNTER — Ambulatory Visit: Payer: BC Managed Care – PPO | Attending: Cardiovascular Disease | Admitting: Cardiovascular Disease

## 2022-01-14 ENCOUNTER — Encounter: Payer: Self-pay | Admitting: Cardiovascular Disease

## 2022-01-14 VITALS — BP 126/80 | HR 56 | Ht 62.0 in | Wt 141.4 lb

## 2022-01-14 DIAGNOSIS — E785 Hyperlipidemia, unspecified: Secondary | ICD-10-CM

## 2022-01-14 DIAGNOSIS — I5181 Takotsubo syndrome: Secondary | ICD-10-CM | POA: Diagnosis not present

## 2022-01-14 MED ORDER — ROSUVASTATIN CALCIUM 10 MG PO TABS
10.0000 mg | ORAL_TABLET | Freq: Every evening | ORAL | 1 refills | Status: DC
Start: 2022-01-14 — End: 2022-07-27

## 2022-01-14 MED ORDER — ISOSORBIDE MONONITRATE ER 30 MG PO TB24: 30.0000 mg | ORAL_TABLET | Freq: Every day | ORAL | 3 refills | Status: DC

## 2022-01-14 NOTE — Patient Instructions (Signed)
Medication Instructions:  STOP the Farxiga  DECREASE the Rosuvastatin to 10 mg once daily  *If you need a refill on your cardiac medications before your next appointment, please call your pharmacy*   Lab Work: None ordered If you have labs (blood work) drawn today and your tests are completely normal, you will receive your results only by: MyChart Message (if you have MyChart) OR A paper copy in the mail If you have any lab test that is abnormal or we need to change your treatment, we will call you to review the results.   Testing/Procedures: None ordered   Follow-Up: At [facility], you and your health needs are our priority.  As part of our continuing mission to provide you with exceptional heart care, we have created designated Provider Care Teams.  These Care Teams include your primary Cardiologist (physician) and Advanced Practice Providers (APPs -  Physician Assistants and Nurse Practitioners) who all work together to provide you with the care you need, when you need it.  We recommend signing up for the patient portal called "MyChart".  Sign up information is provided on this After Visit Summary.  MyChart is used to connect with patients for Virtual Visits (Telemedicine).  Patients are able to view lab/test results, encounter notes, upcoming appointments, etc.  Non-urgent messages can be sent to your provider as well.   To learn more about what you can do with MyChart, go to [email or web address].    Your next appointment:   6 month(s)  The format for your next appointment:   In Person  Provider:   You may see Lorine Bears, MD or one of the following Advanced Practice Providers on your designated Care Team:   Nicolasa Ducking, NP Eula Listen, PA-C Cadence Fransico Michael, PA-C Charlsie Quest, NP

## 2022-01-14 NOTE — Progress Notes (Signed)
Cardiology Office Note   Date:  01/14/2022   ID:  Cindy [name], DOB May 21, 1955, MRN [id number]  PCP:  Jenell Milliner, MD  Cardiologist:   Lorine Bears, MD   Chief Complaint  Patient presents with   Other    4 month f/u c/o chest pain using nitro. Meds reviewed verbally with pt.      History of Present Illness: Cindy [name] is a 66 y.o. female who presents for a follow-up visit regarding stress-induced cardiomyopathy. 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  She had significant stress in early this year when her husband got a job in [place] and they were in the process of moving.  She was hospitalized at [facility] in January of this year with chest pain.  She was found to have elevated high-sensitivity troponin which peaked at 2600.  She had an echocardiogram done which showed mildly reduced LV systolic function with localized wall motion abnormality in the mid to distal anteroseptal wall.  Cardiac catheterization was done which showed normal coronary arteries.  Small vessel disease could not be excluded but I reviewed all her studies again including cath images and echocardiogram and I suspect that her presentation was actually due to stress-induced cardiomyopathy.  She had subsequent echocardiogram in April which showed normalization of LV systolic function and wall motion.  All these images were reviewed with the patient today.  She has been doing well overall.  She does complain of intermittent chest pain if she is under stress that responds to nitroglycerin.  Otherwise she has been doing very well.  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   Past Medical History:  Diagnosis Date   Dyslipidemia    HFrEF (heart failure with reduced ejection fraction) (HCC)    Nonischemic cardiomyopathy (HCC)     Past Surgical History:  Procedure Laterality Date   APPENDECTOMY     CESAREAN SECTION     LEFT  HEART CATH AND CORONARY ANGIOGRAPHY N/A 03/24/2021   Procedure: LEFT HEART CATH AND CORONARY ANGIOGRAPHY;  Surgeon: Iran Ouch, MD;  Location: [facility] INVASIVE CV LAB;  Service: Cardiovascular;  Laterality: N/A;   TONSILLECTOMY       Current Outpatient Medications  Medication Sig Dispense Refill   alendronate (FOSAMAX) 70 MG tablet Take 70 mg by mouth once a week.     aspirin EC 81 MG tablet Take 1 tablet (81 mg total) by mouth daily. 30 tablet 5   dapagliflozin propanediol (FARXIGA) 10 MG TABS tablet Take 1 tablet (10 mg total) by mouth daily before breakfast. 90 tablet 1   isosorbide mononitrate (IMDUR) 30 MG 24 hr tablet Take 1 tablet (30 mg total) by mouth daily. 90 tablet 3   nitroGLYCERIN (NITROSTAT) 0.4 MG SL tablet Place 1 tablet (0.4 mg total) under the tongue every 5 (five) minutes as needed for chest pain. 25 tablet 3   rosuvastatin (CRESTOR) 20 MG tablet Take 1 tablet (20 mg total) by mouth every evening. 90 tablet 0   No current facility-administered medications for this visit.    Allergies:   Patient has no known allergies.    Social History:  The patient  reports that she has never smoked. She has never used smokeless tobacco. She reports current alcohol use. She reports that she does not use drugs.   Family History:  The patient's family history includes Heart disease in her father; Hypertension in her  father.    ROS:  Please see the history of present illness.   Otherwise, review of systems are positive for none.   All other systems are reviewed and negative.    PHYSICAL EXAM: VS:  BP 126/80 (BP Location: Left Arm, Patient Position: Sitting, Cuff Size: Normal)   Pulse (!) 56   Ht 5\' 2"  (1.575 m)   Wt 141 lb 6 oz (64.1 kg)   SpO2 98%   BMI 25.86 kg/m  , BMI Body mass index is 25.86 kg/m. GEN: Well nourished, well developed, in no acute distress  HEENT: normal  Neck: no JVD, carotid bruits, or masses Cardiac: RRR; no murmurs, rubs, or gallops,no edema   Respiratory:  clear to auscultation bilaterally, normal work of breathing GI: soft, nontender, nondistended, + BS MS: no deformity or atrophy  Skin: warm and dry, no rash Neuro:  Strength and sensation are intact Psych: euthymic mood, full affect   EKG:  EKG is ordered today. The ekg ordered today demonstrates normal sinus rhythm with no significant ST or T wave changes.  Low voltage.  Heart rate is 56 bpm.   Recent Labs: 03/23/2021: B Natriuretic Peptide 19.3; Magnesium 2.3; TSH 1.126 03/24/2021: Hemoglobin 12.9; Platelets 346 04/22/2021: BUN 18; Creatinine, Ser 0.87; Potassium 4.7; Sodium 141    Lipid Panel    Component Value Date/Time   CHOL 181 03/24/2021 0512   TRIG 87 03/24/2021 0512   HDL 47 03/24/2021 0512   CHOLHDL 3.9 03/24/2021 0512   VLDL 17 03/24/2021 0512   LDLCALC 117 (H) 03/24/2021 0512      Wt Readings from Last 3 Encounters:  01/14/22 141 lb 6 oz (64.1 kg)  09/30/21 142 lb 8 oz (64.6 kg)  05/19/21 142 lb (64.4 kg)          No data to display            ASSESSMENT AND PLAN:  1.  Stress-induced cardiomyopathy with subsequent normalization of LV systolic function: No need for beta-blocker especially in the setting of baseline bradycardia and normalization of EF.  No evidence of volume overload.  Given that her EF normalized completely with no symptoms of heart failure, I discontinued Farxiga.  Given that she continues to have some episodes of chest pain responsive to sublingual nitroglycerin, I elected to keep her on isosorbide which was refilled today.    2.  Hyperlipidemia: Given the lack of significant coronary artery disease on her cardiac catheterization, I decreased rosuvastatin to 10 mg daily.  Most recent lipid profile in May showed an LDL of 46.    Disposition:   FU with me in 6 months  Signed,  June, MD  01/14/2022 4:22 PM    [facility]

## 2022-06-08 ENCOUNTER — Other Ambulatory Visit: Payer: Self-pay | Admitting: Cardiology

## 2022-07-07 ENCOUNTER — Ambulatory Visit: Payer: BC Managed Care – PPO | Attending: Cardiovascular Disease | Admitting: Medical

## 2022-07-07 ENCOUNTER — Encounter: Payer: Self-pay | Admitting: Medical

## 2022-07-07 VITALS — BP 126/62 | HR 61 | Ht 62.0 in | Wt 148.6 lb

## 2022-07-07 DIAGNOSIS — I5181 Takotsubo syndrome: Secondary | ICD-10-CM | POA: Diagnosis not present

## 2022-07-07 DIAGNOSIS — E782 Mixed hyperlipidemia: Secondary | ICD-10-CM

## 2022-07-07 DIAGNOSIS — I214 Non-ST elevation (NSTEMI) myocardial infarction: Secondary | ICD-10-CM

## 2022-07-07 NOTE — Patient Instructions (Signed)
Medication Instructions:  Your Physician recommend you continue on your current medication as directed.    *If you need a refill on your cardiac medications before your next appointment, please call your pharmacy*   Lab Work: None ordered today   Testing/Procedures: None ordered today   Follow-Up: At [facility], you and your health needs are our priority.  As part of our continuing mission to provide you with exceptional heart care, we have created designated Provider Care Teams.  These Care Teams include your primary Cardiologist (physician) and Advanced Practice Providers (APPs -  Physician Assistants and Nurse Practitioners) who all work together to provide you with the care you need, when you need it.  We recommend signing up for the patient portal called "MyChart".  Sign up information is provided on this After Visit Summary.  MyChart is used to connect with patients for Virtual Visits (Telemedicine).  Patients are able to view lab/test results, encounter notes, upcoming appointments, etc.  Non-urgent messages can be sent to your provider as well.   To learn more about what you can do with MyChart, go to [email or web address].    Your next appointment:   1 year(s)  Provider:   You may see Muhammad Arida, MD or one of the following Advanced Practice Providers on your designated Care Team:   Christopher Berge, NP Ryan Dunn, PA-C Cadence Furth, PA-C Sheri Hammock, NP      

## 2022-07-07 NOTE — Progress Notes (Signed)
Cardiology Office Note:    Date:  07/07/2022   ID:  QUINTA [name], DOB August 23, 1955, MRN [id number]  PCP:  Jenell Milliner, MD  [facility] Cardiologist:  Lorine Bears, MD  [facility] Electrophysiologist:  None   Referring MD: Jenell Milliner, MD   Chief Complaint: 1 year follow-up  History of Present Illness:    Cindy [name] is a 67 y.o. female with a hx of stress-induced CM and HLD who presents for follow-up.   The patient was hospitalized in January 2023 with chest pain, found to have elevated troponin, which peaked at 2600. She had an echo done which showed mildly reduced LVSF with localized wall motion abnormality in the mid to distal anteroseptal wall. Cardiac cath was done, which showed normal coronary arteries. Small vessel disease could not be excluded but images were reviewed and it was felt presentation was due to stress-induced CM. She had a subsequent echo in April, which showed normalization of LVSF and wall motion.   The patient was last seen 12/2021 and was stable from a cardiac perspective. She reported intermittent chest pain improved with NTG. No other changes were made.   Today, the patient reports she has moved to [place]. Patient is overall doing well from a cardiac perspective. Sometime she feel occasional chest pressure on the left shoulder or in the back. This is similar to prior pain, but not as severe. She uses NTG once a month. She is on Imdur. PCP does blood work.   Past Medical History:  Diagnosis Date   Dyslipidemia    HFrEF (heart failure with reduced ejection fraction) (HCC)    Nonischemic cardiomyopathy (HCC)     Past Surgical History:  Procedure Laterality Date   APPENDECTOMY     CESAREAN SECTION     LEFT HEART CATH AND CORONARY ANGIOGRAPHY N/A 03/24/2021   Procedure: LEFT HEART CATH AND CORONARY ANGIOGRAPHY;  Surgeon: Iran Ouch, MD;  Location: [facility] INVASIVE CV LAB;  Service: Cardiovascular;  Laterality: N/A;   TONSILLECTOMY       Current Medications: Current Meds  Medication Sig   alendronate (FOSAMAX) 70 MG tablet Take 70 mg by mouth once a week.   aspirin EC 81 MG tablet Take 1 tablet (81 mg total) by mouth daily.   isosorbide mononitrate (IMDUR) 30 MG 24 hr tablet Take 1 tablet (30 mg total) by mouth daily.   nitroGLYCERIN (NITROSTAT) 0.4 MG SL tablet Place 1 tablet (0.4 mg total) under the tongue every 5 (five) minutes as needed for chest pain.   rosuvastatin (CRESTOR) 10 MG tablet Take 1 tablet (10 mg total) by mouth every evening.     Allergies:   Patient has no known allergies.   Social History   Socioeconomic History   Marital status: Married    Spouse name: Not on file   Number of children: Not on file   Years of education: Not on file   Highest education level: Not on file  Occupational History   Not on file  Tobacco Use   Smoking status: Never   Smokeless tobacco: Never  Vaping Use   Vaping Use: Never used  Substance and Sexual Activity   Alcohol use: Yes    Comment: socially   Drug use: Never   Sexual activity: Yes    Partners: Male  Other Topics Concern   Not on file  Social History Narrative   Not on file   Social Determinants of Health   Financial Resource Strain: Not on  file  Food Insecurity: Not on file  Transportation Needs: Not on file  Physical Activity: Not on file  Stress: Not on file  Social Connections: Not on file     Family History: The patient's family history includes Heart disease in her father; Hypertension in her father.  ROS:   Please see the history of present illness.     All other systems reviewed and are negative.  EKGs/Labs/Other Studies Reviewed:    The following studies were reviewed today:  Echo 05/2021 1. Left ventricular ejection fraction, by estimation, is 55 %. The left  ventricle has normal function. The left ventricle has no regional wall  motion abnormalities. Left ventricular diastolic parameters are consistent  with Grade I  diastolic dysfunction  (impaired relaxation). The average left ventricular global longitudinal  strain is -22.5 %.   2. Right ventricular systolic function is normal. The right ventricular  size is normal.   3. The mitral valve is normal in structure. Mild mitral valve  regurgitation. No evidence of mitral stenosis.   4. The aortic valve is normal in structure. Aortic valve regurgitation is  not visualized. Aortic valve sclerosis is present, with no evidence of  aortic valve stenosis.   5. The inferior vena cava is normal in size with greater than 50%  respiratory variability, suggesting right atrial pressure of 3 mmHg.   Cardiac cath 02/2021   There is mild left ventricular systolic dysfunction.   LV end diastolic pressure is mildly elevated.   The left ventricular ejection fraction is 45-50% by visual estimate.   1.  Near normal coronary arteries with no evidence of obstructive disease.  Possible faint collaterals from the distal PL branch to small left-sided branches. 2.  Mildly reduced LV systolic function.  Wall motion could not be evaluated.  There was anteroseptal wall motion abnormality on echocardiogram.  Mildly elevated left ventricular end-diastolic pressure.   Recommendations: Recommend medical therapy with low-dose aspirin indefinitely, Toprol and rosuvastatin. Differential diagnosis includes small vessel disease versus a variant form of stress-induced cardiomyopathy.  Suspect being able to discharge the patient tomorrow if no issues.   Echo 02/2021 1. Left ventricular ejection fraction, by estimation, is 40 to 45%. The  left ventricle has mildly decreased function. The left ventricle  demonstrates regional wall motion abnormalities (see scoring  diagram/findings for description). Left ventricular  diastolic parameters are consistent with Grade I diastolic dysfunction  (impaired relaxation). There is akinesis of the left ventricular, mid  anteroseptal wall.   2. Right  ventricular systolic function is normal. The right ventricular  size is normal. There is normal pulmonary artery systolic pressure.   3. The mitral valve is normal in structure. No evidence of mitral valve  regurgitation. No evidence of mitral stenosis.   4. The aortic valve is normal in structure. Aortic valve regurgitation is  not visualized. Aortic valve sclerosis/calcification is present, without  any evidence of aortic stenosis.   5. The inferior vena cava is normal in size with <50% respiratory  variability, suggesting right atrial pressure of 8 mmHg.   EKG:  EKG is ordered today.  The ekg ordered today demonstrates NSR 61bpm, no ST/T wave changes  Recent Labs: No results found for requested labs within last 365 days.  Recent Lipid Panel    Component Value Date/Time   CHOL 181 03/24/2021 0512   TRIG 87 03/24/2021 0512   HDL 47 03/24/2021 0512   CHOLHDL 3.9 03/24/2021 0512   VLDL 17 03/24/2021 0512  LDLCALC 117 (H) 03/24/2021 6644     Physical Exam:    VS:  BP 126/62 (BP Location: Left Arm, Patient Position: Sitting, Cuff Size: Normal)   Pulse 61   Ht 5\' 2"  (1.575 m)   Wt 148 lb 9.6 oz (67.4 kg)   SpO2 99%   BMI 27.18 kg/m     Wt Readings from Last 3 Encounters:  07/07/22 148 lb 9.6 oz (67.4 kg)  01/14/22 141 lb 6 oz (64.1 kg)  09/30/21 142 lb 8 oz (64.6 kg)     GEN:  Well nourished, well developed in no acute distress HEENT: Normal NECK: No JVD; No carotid bruits LYMPHATICS: No lymphadenopathy CARDIAC: RRR, no murmurs, rubs, gallops RESPIRATORY:  Clear to auscultation without rales, wheezing or rhonchi  ABDOMEN: Soft, non-tender, non-distended MUSCULOSKELETAL:  No edema; No deformity  SKIN: Warm and dry NEUROLOGIC:  Alert and oriented x 3 PSYCHIATRIC:  Normal affect   ASSESSMENT:    1. Stress-induced cardiomyopathy   2. NSTEMI (non-ST elevated myocardial infarction) (HCC)   3. Hyperlipidemia, mixed    PLAN:    In order of problems listed  above:  Stress induced CM HFimpEF Subsequent normalization of LVEF on echo in 05/2021. No BB given baseline bradycardia. She is of [place]. The patient is euvolemic on exam.The patient moved to [place], but may need refills later this year. She would still like to have a follow-up just in case.   Chest pain with small vessel disease s/p NSTEMI She has occasional chest pressure and takes SL NTG once a month. She is on Imdur 30mg  daily, BP may not tolerate titration of the medication. Continue Aspirin 81mg  daily, Imdur 30mg  daily, Crestor 10mg  daily and SL NTG. No further ischemic work-up at this time.   HLD LDL 61. Continue Crestor 10mg .   Disposition: Follow up in 1 year(s) with MD/APP    Signed, Skyanne Welle David Stall, PA-C  07/07/2022 4:02 PM    [facility]

## 2022-07-17 ENCOUNTER — Ambulatory Visit: Payer: BC Managed Care – PPO | Admitting: Cardiovascular Disease

## 2022-07-25 ENCOUNTER — Other Ambulatory Visit: Payer: Self-pay | Admitting: Cardiovascular Disease

## 2022-12-07 ENCOUNTER — Other Ambulatory Visit: Payer: Self-pay | Admitting: Cardiology

## 2023-06-30 IMAGING — CR DG CHEST 2V
1 series · 2 of 2 positions shown · non-contrast
Comparison: None.

CLINICAL DATA: Acute onset left-sided chest pain 20 minutes ago.
Shortness of breath.

EXAM:
CHEST - 2 VIEW

[Series 1: dg chest 2 view · 0.14mm/px · 2 of 2 slices shown]
[im 1/2]
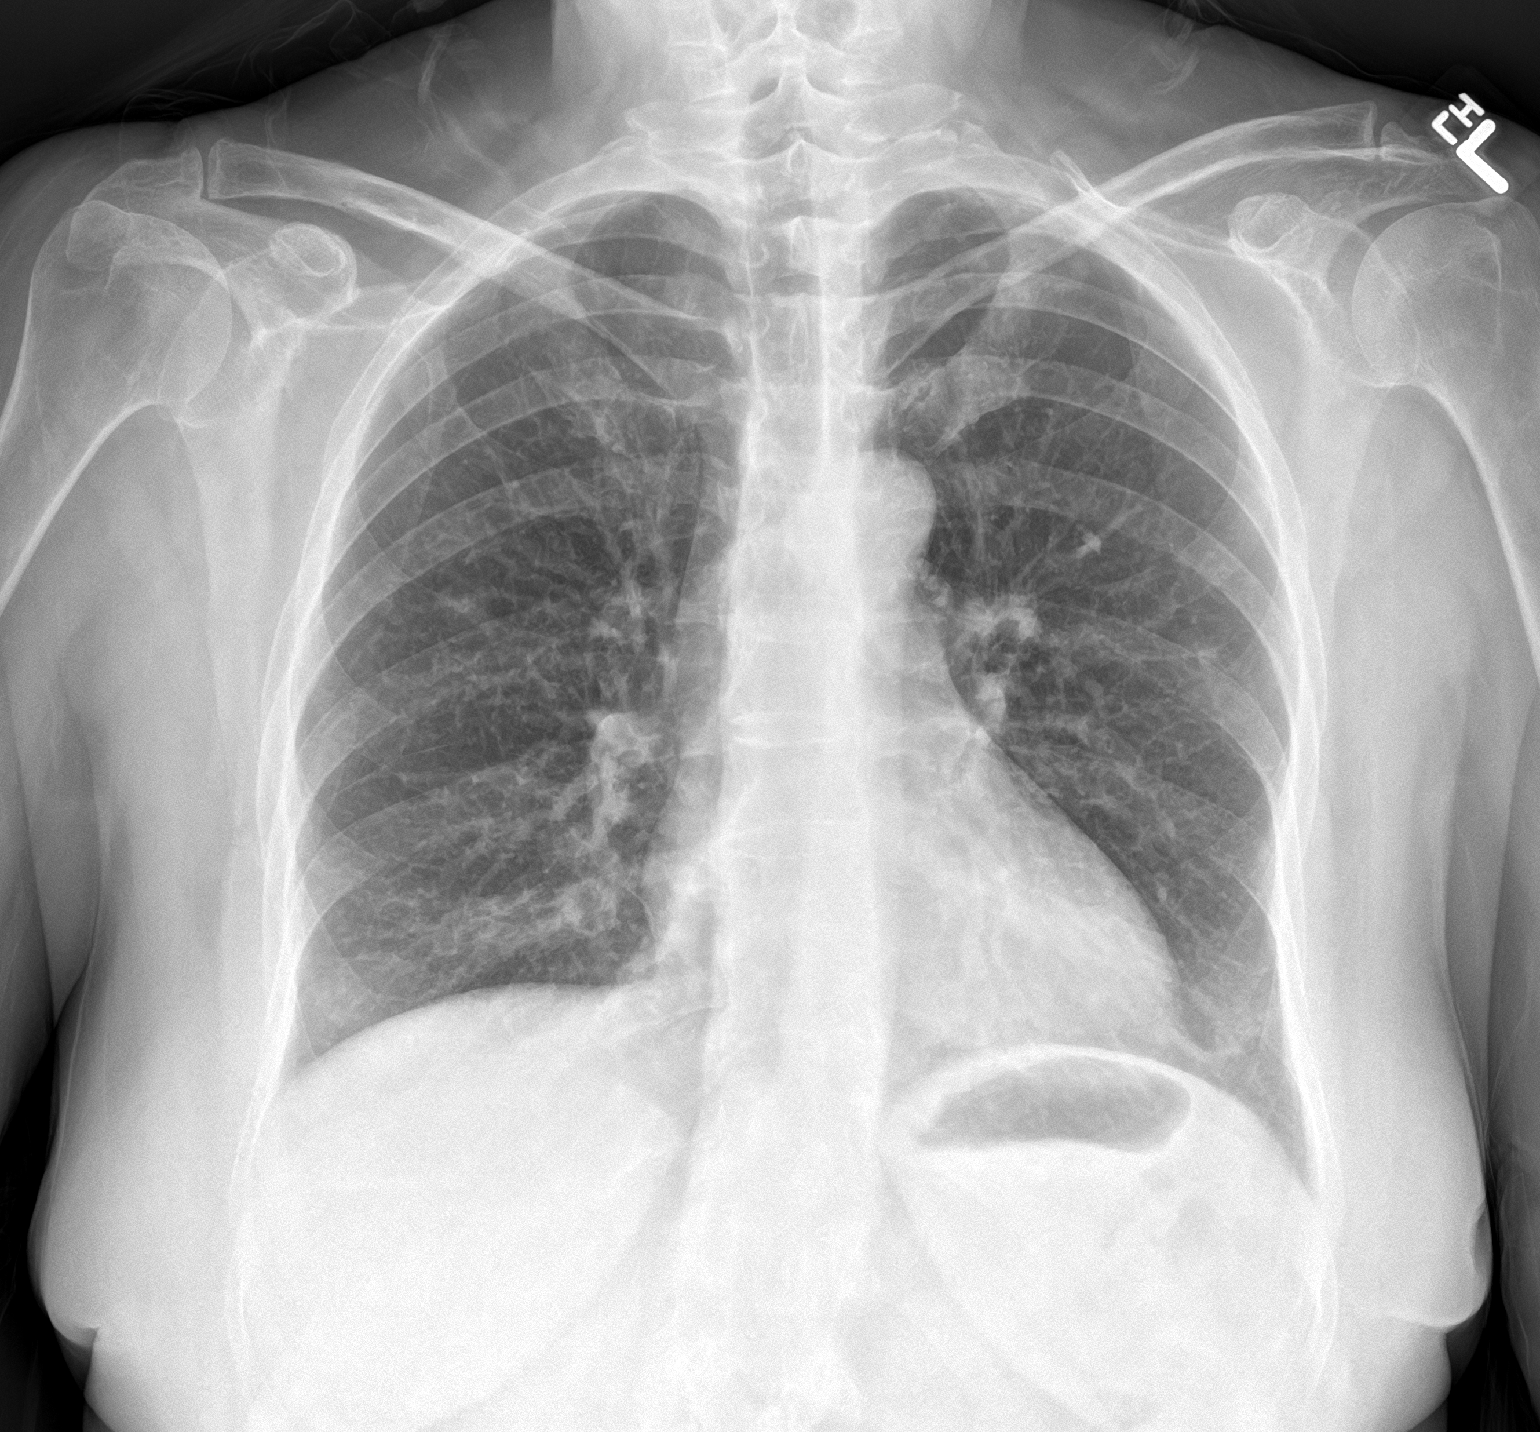
[im 2/2]
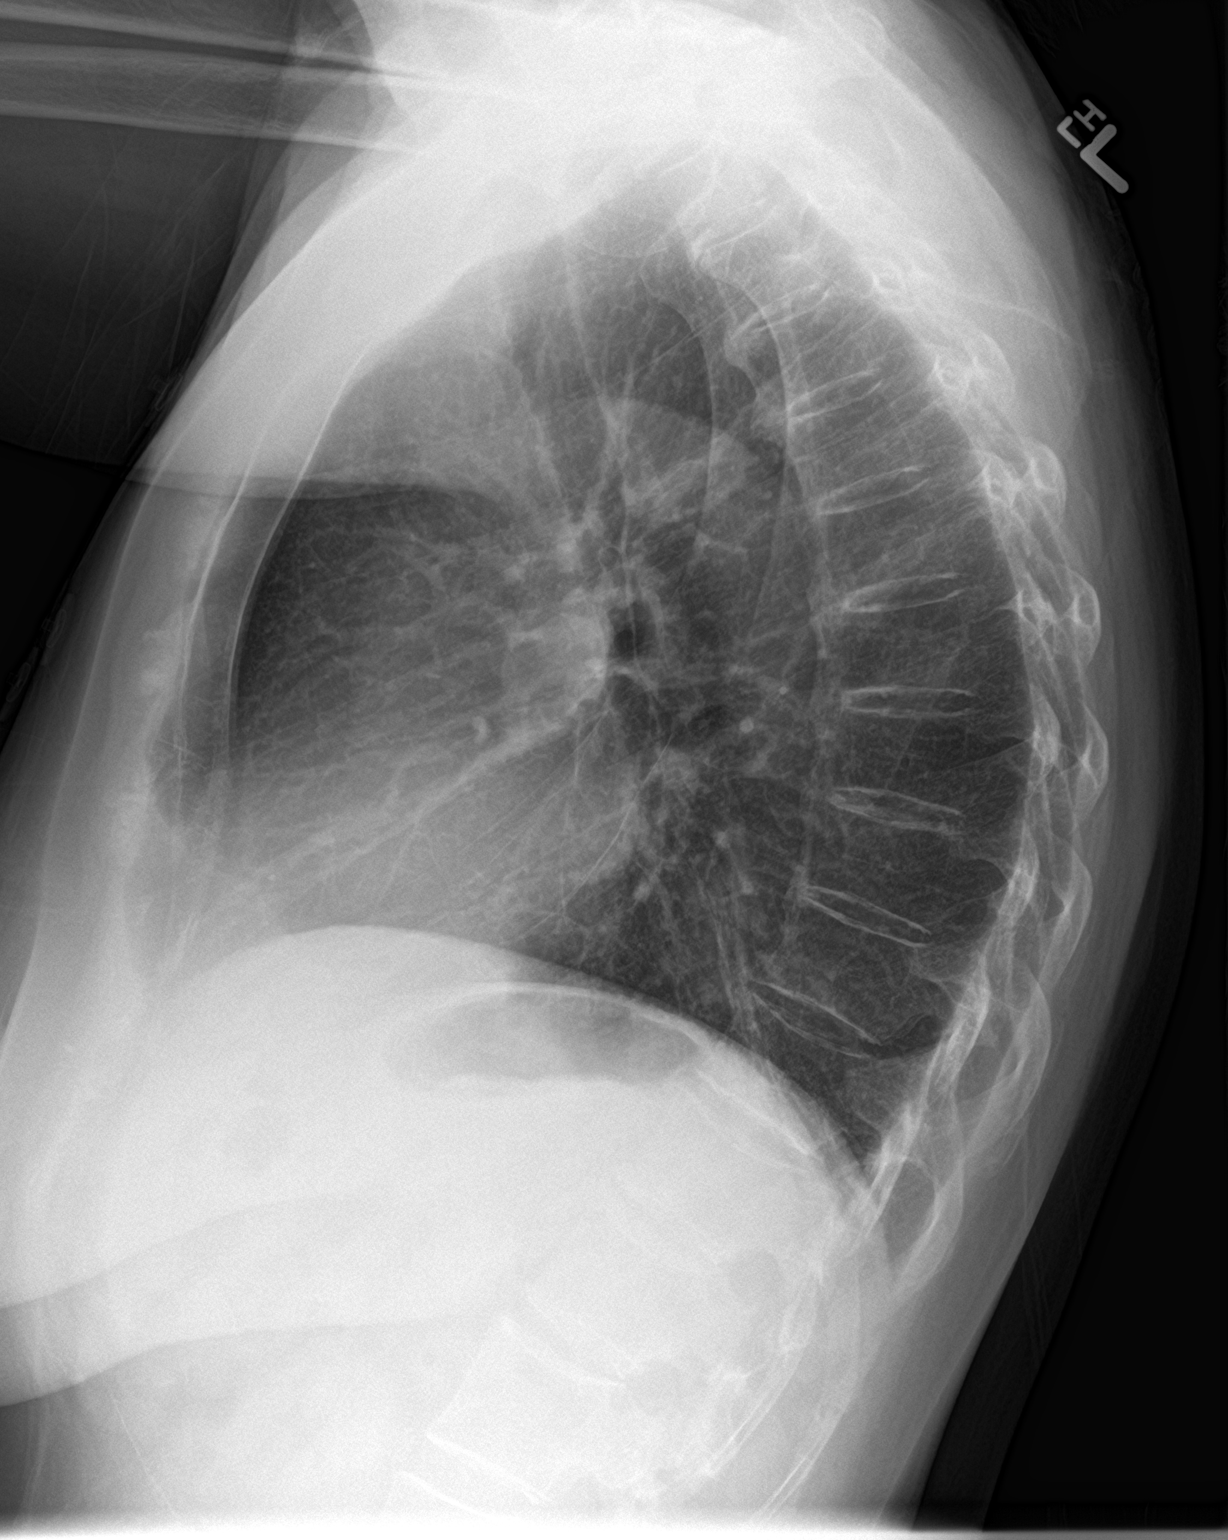

[2 of 2 positions shown; findings below may reference images not displayed]

FINDINGS: The heart size and mediastinal contours are within normal limits.
Both lungs are clear. The visualized skeletal structures are
unremarkable.
IMPRESSION: No active cardiopulmonary disease.

## 2023-10-04 ENCOUNTER — Other Ambulatory Visit: Payer: Self-pay | Admitting: Cardiovascular Disease

## 2023-10-05 NOTE — Telephone Encounter (Signed)
 Please contact pt for future appointment. Pt due for follow up.
